# Patient Record
Sex: Male | Born: 1945 | Race: White | Hispanic: No | Marital: Married | Smoking: Former smoker
Health system: Southern US, Community
[De-identification: ages and names within clinical notes are randomized; demographics above are authoritative.]

## PROBLEM LIST (undated history)

## (undated) DIAGNOSIS — I639 Cerebral infarction, unspecified: Secondary | ICD-10-CM

## (undated) DIAGNOSIS — K859 Acute pancreatitis without necrosis or infection, unspecified: Secondary | ICD-10-CM

## (undated) DIAGNOSIS — C069 Malignant neoplasm of mouth, unspecified: Secondary | ICD-10-CM

## (undated) HISTORY — PX: REPLACEMENT TOTAL HIP W/  RESURFACING IMPLANTS: SUR1222

---

## 2007-12-15 ENCOUNTER — Encounter (HOSPITAL_BASED_OUTPATIENT_CLINIC_OR_DEPARTMENT_OTHER): Admission: RE | Admit: 2007-12-15 | Discharge: 2007-12-31 | Payer: Self-pay | Admitting: Surgery

## 2008-01-27 ENCOUNTER — Ambulatory Visit (HOSPITAL_COMMUNITY): Admission: RE | Admit: 2008-01-27 | Discharge: 2008-01-27 | Payer: Self-pay | Admitting: Surgery

## 2008-03-09 ENCOUNTER — Encounter (HOSPITAL_BASED_OUTPATIENT_CLINIC_OR_DEPARTMENT_OTHER): Admission: RE | Admit: 2008-03-09 | Discharge: 2008-04-08 | Payer: Self-pay | Admitting: Internal Medicine

## 2008-04-09 ENCOUNTER — Encounter (HOSPITAL_BASED_OUTPATIENT_CLINIC_OR_DEPARTMENT_OTHER): Admission: RE | Admit: 2008-04-09 | Discharge: 2008-05-04 | Payer: Self-pay | Admitting: Internal Medicine

## 2008-05-05 ENCOUNTER — Encounter (HOSPITAL_BASED_OUTPATIENT_CLINIC_OR_DEPARTMENT_OTHER): Admission: RE | Admit: 2008-05-05 | Discharge: 2008-05-20 | Payer: Self-pay | Admitting: Internal Medicine

## 2008-12-27 ENCOUNTER — Ambulatory Visit (HOSPITAL_COMMUNITY): Admission: RE | Admit: 2008-12-27 | Discharge: 2008-12-27 | Payer: Self-pay | Admitting: General Surgery

## 2008-12-27 ENCOUNTER — Encounter (HOSPITAL_BASED_OUTPATIENT_CLINIC_OR_DEPARTMENT_OTHER): Admission: RE | Admit: 2008-12-27 | Discharge: 2009-02-10 | Payer: Self-pay | Admitting: General Surgery

## 2009-01-26 ENCOUNTER — Ambulatory Visit: Payer: Self-pay | Admitting: Interventional Radiology

## 2009-01-26 ENCOUNTER — Emergency Department (HOSPITAL_BASED_OUTPATIENT_CLINIC_OR_DEPARTMENT_OTHER): Admission: EM | Admit: 2009-01-26 | Discharge: 2009-01-26 | Payer: Self-pay | Admitting: Emergency Medicine

## 2009-02-13 ENCOUNTER — Encounter (HOSPITAL_BASED_OUTPATIENT_CLINIC_OR_DEPARTMENT_OTHER): Admission: RE | Admit: 2009-02-13 | Discharge: 2009-03-07 | Payer: Self-pay | Admitting: Internal Medicine

## 2010-11-12 LAB — APTT: aPTT: 29 s (ref 24–37)

## 2010-11-12 LAB — COMPREHENSIVE METABOLIC PANEL
ALT: 15 U/L (ref 0–53)
AST: 25 U/L (ref 0–37)
Albumin: 3.9 g/dL (ref 3.5–5.2)
CO2: 27 mEq/L (ref 19–32)
Chloride: 102 mEq/L (ref 96–112)
GFR calc Af Amer: >60 mL/min (ref >60)
GFR calc non Af Amer: >60 mL/min (ref >60)
Potassium: 3.7 mEq/L (ref 3.5–5.1)
Sodium: 140 mEq/L (ref 135–145)
Total Bilirubin: 0.8 mg/dL (ref 0.3–1.2)

## 2010-11-12 LAB — CBC
HCT: 50.1 % (ref 39.0–52.0)
Hemoglobin: 16.8 g/dL (ref 13.0–17.0)
MCHC: 33.6 g/dL (ref 30.0–36.0)
MCV: 106.1 fL — ABNORMAL HIGH (ref 78.0–100.0)
Platelets: 175 K/uL (ref 150–400)
RBC: 4.72 MIL/uL (ref 4.22–5.81)
RDW: 12.1 % (ref 11.5–15.5)
WBC: 9 K/uL (ref 4.0–10.5)

## 2010-11-12 LAB — URINALYSIS, ROUTINE W REFLEX MICROSCOPIC
Glucose, UA: NEGATIVE mg/dL
Ketones, ur: NEGATIVE mg/dL
Leukocytes, UA: NEGATIVE
Protein, ur: NEGATIVE mg/dL
pH: 6 (ref 5.0–8.0)

## 2010-11-12 LAB — DIFFERENTIAL
Basophils Absolute: 0.1 K/uL (ref 0.0–0.1)
Basophils Relative: 1 % (ref 0–1)
Eosinophils Absolute: 0.1 K/uL (ref 0.0–0.7)
Eosinophils Relative: 1 % (ref 0–5)
Lymphocytes Relative: 20 % (ref 12–46)
Lymphs Abs: 1.8 K/uL (ref 0.7–4.0)
Monocytes Absolute: 0.6 K/uL (ref 0.1–1.0)
Monocytes Relative: 7 % (ref 3–12)
Neutro Abs: 6.4 K/uL (ref 1.7–7.7)
Neutrophils Relative %: 72 % (ref 43–77)

## 2010-11-12 LAB — POCT CARDIAC MARKERS: CKMB, poc: <1 ng/mL — ABNORMAL LOW (ref 1.0–8.0)

## 2010-11-12 LAB — PROTIME-INR
INR: 1 (ref 0.00–1.49)
Prothrombin Time: 13.4 s (ref 11.6–15.2)

## 2010-11-12 LAB — URINE MICROSCOPIC-ADD ON

## 2010-12-18 NOTE — Assessment & Plan Note (Signed)
[facility]   NAME:  GUNTHER, ZAWADZKI               ACCOUNT NO.:  [id number]   MEDICAL RECORD NO.:  [id number]      DATE OF BIRTH:  15-Apr-1946   PHYSICIAN:  Theresia Majors. Tanda Rockers, M.D.      VISIT DATE:                                   OFFICE VISIT   SUBJECTIVE:  Mr. Encina is a 65 year old man referred by Dr. Leafy Ro., DDS, for evaluation and treatment of osteoradionecrosis of  the left mandible.   IMPRESSION:  Stage I osteoradionecrosis, left mandible.   RECOMMENDATIONS:  Proceed with hyperbaric oxygen therapy to be  administered at 100% oxygen at 2.4 atm with two 5-minute air breaks.  The patient will require concurrent serial exams per Dr. Perlie Gold to  assess the response to therapy.  We are recommending an initial 30  treatments.   SUBJECTIVE:  Mr. 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  Over the last 2 years, he has had progressive  difficulty with the stability of his teeth.  He has been seen by Dr.  Perlie Gold on several occasions with respirations and has insidiously  developed and exposed ulcer on the inferior ramus of the left mandible.  He denies pain or drainage.   His past medical history is remarkable for significant ethanol  consumption.  He continues to smoke a pack of cigarettes a day.  He has  tried multiple modalities to discontinue smoking, but has failed.  He  has had a history of pancreatitis, benign prostatic hypertrophy, asthma,  osteoarthritis, and a history of jaundice.   He is allergic to PENICILLIN.   His current medications include multiple vitamins and Nexium.   His previous surgery has included a cholecystectomy, a right hip  replacement, tonsillectomy, and adenoidectomy.  He has also had the  aforementioned radiation therapy.   His family history is positive for cancer and vascular disease.   Socially, he  is married.  He has adult children.  He is an Psychologist, educational at  a moving company.   On review of systems, he denies a productive cough.  He denies transient  visual impairment of vision loss.  There has been no upper or lower  extremity paralysis, loss of vision, or lightheadedness.  He continues  to smoke and does not have a productive cough.  He denies hemoptysis.  He specifically denies chest pain.  His appetite is good.  His weight is  stable.  There are no bowel or bladder complaints.  There are no  arthralgias or myalgias.   On physical exam, he is alert and oriented in good contact with reality.  His blood pressure is 127/77, respirations 16, pulse rate 84, and  temperature is 98.2.  The HEENT exam is remarkable for normal-appearing  tympanic membranes.  The inferior turbinates are nonpathologic.  The  oral cavity exam shows multiple restorations with attention focused in  the inferior ramus of the left jaw.  There is a well-circumscribed area  of ulceration with visible ischemic-appearing bone.  The edges of the  gingiva are rolled down.  This area is somewhat tender to probing with a  Q-tip.  A photograph was taken of this area and entered into the  database (please refer to the image).  There is no cervical adenopathy.  The carotid upstroke is symmetrical.  Bruit is not appreciated.  The  trachea is midline.  Thyroid is nonpalpable.  Lungs are clear.  The  heart sounds are distant.  The abdomen is soft.  Extremity exam is  unremarkable.  The dorsalis pedis pulses are readily palpable.  The  patient moves all 4.   ASSESSMENT:  Stage I osteoradionecrosis.  We are recommending an initial  course of hyperbaric oxygen treatment as described above.  The patient  will require between 30 and 40 treatments initially with a follow up  exam per Dr. Perlie Gold.   We have explained the diagnosis, the indications for hyperbaric oxygen  therapy.  We have discussed the potential complications  specifically,  claustrophobia, barotrauma, and oxygen toxicity with the patient in  terms he seems to understand.  We have reviewed the restrictions of  material in the hyperbaric area as well as in the chamber.  We have  given the patient an opportunity to ask questions.  We provided him with  a copy of the patient information detailing the discussion.  He seems to  understand and expresses gratitude for having been seen in the clinic  and is anxious to proceed with hyperbarics.      Harold A. Tanda Rockers, M.D.  Electronically Signed     HAN/MEDQ  D:  12/18/2007  T:  12/19/2007  Job:  [id number]   cc:   Viviann Spare
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
## 2015-03-11 ENCOUNTER — Encounter (HOSPITAL_BASED_OUTPATIENT_CLINIC_OR_DEPARTMENT_OTHER): Payer: Self-pay | Admitting: Emergency Medicine

## 2015-03-11 ENCOUNTER — Emergency Department (HOSPITAL_BASED_OUTPATIENT_CLINIC_OR_DEPARTMENT_OTHER): Payer: Medicare HMO

## 2015-03-11 ENCOUNTER — Emergency Department (HOSPITAL_BASED_OUTPATIENT_CLINIC_OR_DEPARTMENT_OTHER)
Admission: EM | Admit: 2015-03-11 | Discharge: 2015-03-11 | Disposition: A | Discharge status: Home / Self Care | Payer: Medicare HMO | Attending: Emergency Medicine | Admitting: Emergency Medicine

## 2015-03-11 DIAGNOSIS — Z8719 Personal history of other diseases of the digestive system: Secondary | ICD-10-CM | POA: Insufficient documentation

## 2015-03-11 DIAGNOSIS — Z8673 Personal history of transient ischemic attack (TIA), and cerebral infarction without residual deficits: Secondary | ICD-10-CM | POA: Diagnosis not present

## 2015-03-11 DIAGNOSIS — Z85819 Personal history of malignant neoplasm of unspecified site of lip, oral cavity, and pharynx: Secondary | ICD-10-CM | POA: Insufficient documentation

## 2015-03-11 DIAGNOSIS — Z87891 Personal history of nicotine dependence: Secondary | ICD-10-CM | POA: Insufficient documentation

## 2015-03-11 DIAGNOSIS — M546 Pain in thoracic spine: Secondary | ICD-10-CM | POA: Diagnosis not present

## 2015-03-11 DIAGNOSIS — R109 Unspecified abdominal pain: Secondary | ICD-10-CM

## 2015-03-11 DIAGNOSIS — Z880 Allergy status to penicillin: Secondary | ICD-10-CM | POA: Diagnosis not present

## 2015-03-11 DIAGNOSIS — M545 Low back pain: Secondary | ICD-10-CM | POA: Diagnosis not present

## 2015-03-11 DIAGNOSIS — R079 Chest pain, unspecified: Secondary | ICD-10-CM | POA: Insufficient documentation

## 2015-03-11 DIAGNOSIS — R1012 Left upper quadrant pain: Secondary | ICD-10-CM | POA: Diagnosis not present

## 2015-03-11 HISTORY — DX: Malignant neoplasm of mouth, unspecified: C06.9

## 2015-03-11 HISTORY — DX: Acute pancreatitis without necrosis or infection, unspecified: K85.90

## 2015-03-11 HISTORY — DX: Cerebral infarction, unspecified: I63.9

## 2015-03-11 LAB — CBC WITH DIFFERENTIAL/PLATELET
BASOS PCT: 0 % (ref 0–1)
Basophils Absolute: 0 10*3/uL (ref 0.0–0.1)
Eosinophils Absolute: 0.2 10*3/uL (ref 0.0–0.7)
Eosinophils Relative: 1 % (ref 0–5)
HEMATOCRIT: 43 % (ref 39.0–52.0)
HEMOGLOBIN: 14.3 g/dL (ref 13.0–17.0)
LYMPHS ABS: 1.9 10*3/uL (ref 0.7–4.0)
LYMPHS PCT: 17 % (ref 12–46)
MCH: 34.4 pg — ABNORMAL HIGH (ref 26.0–34.0)
MCHC: 33.3 g/dL (ref 30.0–36.0)
MCV: 103.4 fL — ABNORMAL HIGH (ref 78.0–100.0)
Monocytes Absolute: 0.9 10*3/uL (ref 0.1–1.0)
Monocytes Relative: 8 % (ref 3–12)
NEUTROS ABS: 8.5 10*3/uL — AB (ref 1.7–7.7)
Neutrophils Relative %: 74 % (ref 43–77)
Platelets: 169 10*3/uL (ref 150–400)
RBC: 4.16 MIL/uL — ABNORMAL LOW (ref 4.22–5.81)
RDW: 12.1 % (ref 11.5–15.5)
WBC: 11.5 10*3/uL — ABNORMAL HIGH (ref 4.0–10.5)

## 2015-03-11 LAB — URINALYSIS, ROUTINE W REFLEX MICROSCOPIC
BILIRUBIN URINE: NEGATIVE
Glucose, UA: NEGATIVE mg/dL
Hgb urine dipstick: NEGATIVE
Ketones, ur: 15 mg/dL — AB
Leukocytes, UA: NEGATIVE
Nitrite: NEGATIVE
PH: 6 (ref 5.0–8.0)
PROTEIN: NEGATIVE mg/dL
Specific Gravity, Urine: 1.019 (ref 1.005–1.030)
Urobilinogen, UA: 0.2 mg/dL (ref 0.0–1.0)

## 2015-03-11 LAB — COMPREHENSIVE METABOLIC PANEL
ALBUMIN: 3.7 g/dL (ref 3.5–5.0)
ALT: 19 U/L (ref 17–63)
AST: 19 U/L (ref 15–41)
Alkaline Phosphatase: 68 U/L (ref 38–126)
Anion gap: 10 (ref 5–15)
BILIRUBIN TOTAL: 0.7 mg/dL (ref 0.3–1.2)
BUN: 13 mg/dL (ref 6–20)
CALCIUM: 8.6 mg/dL — AB (ref 8.9–10.3)
CO2: 26 mmol/L (ref 22–32)
CREATININE: 1.02 mg/dL (ref 0.61–1.24)
Chloride: 103 mmol/L (ref 101–111)
GLUCOSE: 108 mg/dL — AB (ref 65–99)
Potassium: 4.3 mmol/L (ref 3.5–5.1)
Sodium: 139 mmol/L (ref 135–145)
Total Protein: 6.6 g/dL (ref 6.5–8.1)

## 2015-03-11 LAB — TROPONIN I

## 2015-03-11 LAB — LIPASE, BLOOD: Lipase: 38 U/L (ref 22–51)

## 2015-03-11 MED ORDER — IOHEXOL 300 MG/ML  SOLN
100.0000 mL | Freq: Once | INTRAMUSCULAR | Status: AC | PRN
  Administered 2015-03-11: 100 mL via INTRAVENOUS

## 2015-03-11 MED ORDER — SODIUM CHLORIDE 0.9 % IV SOLN
Freq: Once | INTRAVENOUS | Status: AC
  Administered 2015-03-11: 17:00:00 via INTRAVENOUS

## 2015-03-11 MED ORDER — IOHEXOL 300 MG/ML  SOLN
25.0000 mL | Freq: Once | INTRAMUSCULAR | Status: AC | PRN
  Administered 2015-03-11: 25 mL via ORAL

## 2015-03-11 MED ORDER — ONDANSETRON 4 MG PO TBDP: 4.0000 mg | ORAL_TABLET | Freq: Three times a day (TID) | ORAL | Status: DC | PRN

## 2015-03-11 MED ORDER — MORPHINE SULFATE 4 MG/ML IJ SOLN
4.0000 mg | INTRAMUSCULAR | Status: DC | PRN
  Administered 2015-03-11: 4 mg via INTRAVENOUS
  Filled 2015-03-11: qty 1

## 2015-03-11 MED ORDER — SUCRALFATE 1 G PO TABS: 1.0000 g | ORAL_TABLET | Freq: Four times a day (QID) | ORAL | Status: AC

## 2015-03-11 MED ORDER — ONDANSETRON HCL 4 MG/2ML IJ SOLN
4.0000 mg | Freq: Once | INTRAMUSCULAR | Status: AC
  Administered 2015-03-11: 4 mg via INTRAVENOUS
  Filled 2015-03-11: qty 2

## 2015-03-11 NOTE — Discharge Instructions (Signed)
Small meals. Liquids only today. Avoid alcohol, tobacco, caffeine, and anti-inflammatory medicines like aspirin Motrin and naproxen. Continue your Protonix. Return here with any worsening symptoms including chest pain shortness of breath fever   Abdominal Pain Many things can cause abdominal pain. Usually, abdominal pain is not caused by a disease and will improve without treatment. It can often be observed and treated at home. Your health care provider will do a physical exam and possibly order blood tests and X-rays to help determine the seriousness of your pain. However, in many cases, more time must pass before a clear cause of the pain can be found. Before that point, your health care provider may not know if you need more testing or further treatment. HOME CARE INSTRUCTIONS  Monitor your abdominal pain for any changes. The following actions may help to alleviate any discomfort you are experiencing:  Only take over-the-counter or prescription medicines as directed by your health care provider.  Do not take laxatives unless directed to do so by your health care provider.  Try a clear liquid diet (broth, tea, or water) as directed by your health care provider. Slowly move to a bland diet as tolerated. SEEK MEDICAL CARE IF:  You have unexplained abdominal pain.  You have abdominal pain associated with nausea or diarrhea.  You have pain when you urinate or have a bowel movement.  You experience abdominal pain that wakes you in the night.  You have abdominal pain that is worsened or improved by eating food.  You have abdominal pain that is worsened with eating fatty foods.  You have a fever. SEEK IMMEDIATE MEDICAL CARE IF:   Your pain does not go away within 2 hours.  You keep throwing up (vomiting).  Your pain is felt only in portions of the abdomen, such as the right side or the left lower portion of the abdomen.  You pass bloody or black tarry stools. MAKE SURE  YOU:  Understand these instructions.   Will watch your condition.   Will get help right away if you are not doing well or get worse.  Document Released: 05/01/2005 Document Revised: 07/27/2013 Document Reviewed: 03/31/2013 [facility] Patient Information 2015 [place], [place]. This information is not intended to replace advice given to you by your health care provider. Make sure you discuss any questions you have with your health care provider.  Chest Pain (Nonspecific) It is often hard to give a specific diagnosis for the cause of chest pain. There is always a chance that your pain could be related to something serious, such as a heart attack or a blood clot in the lungs. You need to follow up with your health care provider for further evaluation. CAUSES   Heartburn.  Pneumonia or bronchitis.  Anxiety or stress.  Inflammation around your heart (pericarditis) or lung (pleuritis or pleurisy).  A blood clot in the lung.  A collapsed lung (pneumothorax). It can develop suddenly on its own (spontaneous pneumothorax) or from trauma to the chest.  Shingles infection (herpes zoster virus). The chest wall is composed of bones, muscles, and cartilage. Any of these can be the source of the pain.  The bones can be bruised by injury.  The muscles or cartilage can be strained by coughing or overwork.  The cartilage can be affected by inflammation and become sore (costochondritis). DIAGNOSIS  Lab tests or other studies may be needed to find the cause of your pain. Your health care provider may have you take a test called an  ambulatory electrocardiogram (ECG). An ECG records your heartbeat patterns over a 24-hour period. You may also have other tests, such as:  Transthoracic echocardiogram (TTE). During echocardiography, sound waves are used to evaluate how blood flows through your heart.  Transesophageal echocardiogram (TEE).  Cardiac monitoring. This allows your health care provider to  monitor your heart rate and rhythm in real time.  Holter monitor. This is a portable device that records your heartbeat and can help diagnose heart arrhythmias. It allows your health care provider to track your heart activity for several days, if needed.  Stress tests by exercise or by giving medicine that makes the heart beat faster. TREATMENT   Treatment depends on what may be causing your chest pain. Treatment may include:  Acid blockers for heartburn.  Anti-inflammatory medicine.  Pain medicine for inflammatory conditions.  Antibiotics if an infection is present.  You may be advised to change lifestyle habits. This includes stopping smoking and avoiding alcohol, caffeine, and chocolate.  You may be advised to keep your head raised (elevated) when sleeping. This reduces the chance of acid going backward from your stomach into your esophagus. Most of the time, nonspecific chest pain will improve within 2-3 days with rest and mild pain medicine.  HOME CARE INSTRUCTIONS   If antibiotics were prescribed, take them as directed. Finish them even if you start to feel better.  For the next few days, avoid physical activities that bring on chest pain. Continue physical activities as directed.  Do not use any tobacco products, including cigarettes, chewing tobacco, or electronic cigarettes.  Avoid drinking alcohol.  Only take medicine as directed by your health care provider.  Follow your health care provider's suggestions for further testing if your chest pain does not go away.  Keep any follow-up appointments you made. If you do not go to an appointment, you could develop lasting (chronic) problems with pain. If there is any problem keeping an appointment, call to reschedule. SEEK MEDICAL CARE IF:   Your chest pain does not go away, even after treatment.  You have a rash with blisters on your chest.  You have a fever. SEEK IMMEDIATE MEDICAL CARE IF:   You have increased chest  pain or pain that spreads to your arm, neck, jaw, back, or abdomen.  You have shortness of breath.  You have an increasing cough, or you cough up blood.  You have severe back or abdominal pain.  You feel nauseous or vomit.  You have severe weakness.  You faint.  You have chills. This is an emergency. Do not wait to see if the pain will go away. Get medical help at once. Call your local emergency services (911 in [place] Do not drive yourself to the hospital. MAKE SURE YOU:   Understand these instructions.  Will watch your condition.  Will get help right away if you are not doing well or get worse. Document Released: 05/01/2005 Document Revised: 07/27/2013 Document Reviewed: 02/25/2008 [facility] Patient Information 2015 [place], [place]. This information is not intended to replace advice given to you by your health care provider. Make sure you discuss any questions you have with your health care provider.

## 2015-03-11 NOTE — ED Notes (Signed)
Patient transported to CT 

## 2015-03-11 NOTE — ED Notes (Signed)
Patient transported to X-ray 

## 2015-03-11 NOTE — ED Notes (Signed)
Pt in c/o back pain, L lateral rib cage pain onset after working in the garden all day. Also states abdominal pain onset last night that lasted all night and into the day. Pt is in NAD in triage.

## 2015-03-11 NOTE — ED Provider Notes (Signed)
CSN: [id number]   Arrival date & time 03/11/15 1525  History  This chart was scribed for  Samuel Furry, MD by Altamease Oiler, ED Scribe. This patient was seen in room MH12/MH12 and the patient's care was started at 3:55 PM.  Chief Complaint  Patient presents with  . Chest Pain  . Abdominal Pain    HPI The history is provided by the patient. No language interpreter was used.   Samuel Mccarthy is a 69 y.o. male with PMHx of pancreatitis who presents to the Emergency Department complaining of constant mid abdominal pain with sudden onset around 3 AM this morning. The pain is described as pressure and less severe than previous pain with pancreatitis. His last episode of pancreatitis was more than 5 years ago.  Associated symptoms include left-sided chest pain with onset before 11:30 AM (sharp and shooting)and new dull lower thoracic and upper lumbar pain. He had a normal bowel movement today without blood or mucous. Last week he had 4 days of nausea and vomiting.  No increased abdominal bloating, current nausea or vomiting, diarrhea, hematochezia, dysuria, and difficulty urinating. Today the pt did yard and garden work. He ate an egg and a spoon of peanut butter. Past surgical history is significant for appendectomy and cholecystectomy. No history of CAD or kidney stones. Last used tobacco 6 years ago. Daily drinker (3-4 beers).   Past Medical History  Diagnosis Date  . Pancreatitis   . Oral cancer   . Stroke     Past Surgical History  Procedure Laterality Date  . Replacement total hip w/  resurfacing implants      History reviewed. No pertinent family history.  History  Substance Use Topics  . 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 Smokeless tobacco: Never Used  . Alcohol Use: 2.4 oz/week    4 Cans of beer per week     Comment: every day     Review of Systems  Constitutional: Negative for diaphoresis and appetite change.  HENT: Negative for mouth sores and trouble swallowing.   Eyes:  Negative for visual disturbance.  Respiratory: Negative for chest tightness and wheezing.   Cardiovascular: Positive for chest pain.  Gastrointestinal: Positive for abdominal pain. Negative for abdominal distention.  Endocrine: Negative for polydipsia, polyphagia and polyuria.  Genitourinary: Negative for frequency.  Musculoskeletal: Positive for back pain. Negative for gait problem.  Skin: Negative for color change, pallor and rash.  Neurological: Negative for dizziness, syncope, light-headedness and headaches.  Hematological: Does not bruise/bleed easily.  Psychiatric/Behavioral: Negative for behavioral problems and confusion.     Home Medications   Prior to Admission medications   Not on File    Allergies  Penicillins and Percocet  Triage Vitals: BP 154/79 mmHg  Pulse 100  Temp(Src) 98.1 F (36.7 C) (Oral)  Resp 20  Ht 5\' 5"  (1.651 m)  Wt 184 lb (83.462 kg)  BMI 30.62 kg/m2  SpO2 95%  Physical Exam  Constitutional: He is oriented to person, place, and time. He appears well-developed and well-nourished. No distress.  HENT:  Head: Normocephalic.  Mouth/Throat: Mucous membranes are normal.  Eyes: Conjunctivae are normal. Pupils are equal, round, and reactive to light. No scleral icterus.  Neck: Normal range of motion. Neck supple. No thyromegaly present.  Cardiovascular: Normal rate and regular rhythm.  Exam reveals no gallop and no friction rub.   No murmur heard. Pulmonary/Chest: Effort normal and breath sounds normal. No respiratory distress. He has no wheezes. He has no rales. He  exhibits tenderness.  Left upper lateral chest tenderness  Abdominal: Soft. Bowel sounds are normal. He exhibits no distension. There is tenderness in the left upper quadrant. There is no rebound.  Musculoskeletal: Normal range of motion.  Neurological: He is alert and oriented to person, place, and time.  Skin: Skin is warm and dry. No rash noted.  Psychiatric: He has a normal mood and  affect. His behavior is normal.    ED Course  Procedures   DIAGNOSTIC STUDIES: Oxygen Saturation is 95% on RA, normal by my interpretation.    COORDINATION OF CARE: 4:07 PM Discussed treatment plan which includes CT A/P with contrast, CXR, lab work, EKG, morphine, Zofran, and IVF  with pt at bedside and pt agreed to plan.  Labs Review-  Labs Reviewed  CBC WITH DIFFERENTIAL/PLATELET - Abnormal; Notable for the following:    WBC 11.5 (*)    RBC 4.16 (*)    MCV 103.4 (*)    MCH 34.4 (*)    Neutro Abs 8.5 (*)    All other components within normal limits  COMPREHENSIVE METABOLIC PANEL  LIPASE, BLOOD  TROPONIN I  URINALYSIS, ROUTINE W REFLEX MICROSCOPIC (NOT AT [facility])    Imaging Review No results found.  EKG Interpretation  Date/Time:  Saturday March 11 2015 15:33:25 EDT Ventricular Rate:  102 PR Interval:  168 QRS Duration: 82 QT Interval:  328 QTC Calculation: 427 R Axis:   8 Text Interpretation:  Sinus tachycardia Minimal voltage criteria for LVH, may be normal variant Borderline ECG Confirmed by Jeneen Rinks  MD, [place] ([id number]) on 03/11/2015 4:07:42 PM       MDM   Final diagnoses:  Abdominal pain  Chest pain  Abdominal pain  Chest pain    Reassuring studies. No aneurysm. No inflammation of the appendix. No elevation of hepatobiliary pain chronic enzymes. Normal troponin after more than 10 hours of symptoms. Normal x-ray. Unchanged EKG. Symptoms sound more GI. His been eating a lot of fried zuccini  and tomatoes out of his garden. His wife states he drinks coffee all day long. He has 3-5 beers per day. Plan will be treatment with Carafate in addition to his Protonix. Reflux and peptic ulcer diet and precautions. ER with any changes.   .I personally performed the services described in this documentation, which was scribed in my presence. The recorded information has been reviewed and is accurate.     Samuel Furry, MD 03/11/15 [phone]

## 2018-05-02 ENCOUNTER — Other Ambulatory Visit: Payer: Self-pay

## 2018-05-02 ENCOUNTER — Emergency Department (HOSPITAL_BASED_OUTPATIENT_CLINIC_OR_DEPARTMENT_OTHER)
Admission: EM | Admit: 2018-05-02 | Discharge: 2018-05-02 | Disposition: A | Discharge status: Home / Self Care | Payer: Medicare HMO | Attending: Emergency Medicine | Admitting: Emergency Medicine

## 2018-05-02 ENCOUNTER — Emergency Department (HOSPITAL_BASED_OUTPATIENT_CLINIC_OR_DEPARTMENT_OTHER): Payer: Medicare HMO

## 2018-05-02 ENCOUNTER — Encounter (HOSPITAL_BASED_OUTPATIENT_CLINIC_OR_DEPARTMENT_OTHER): Payer: Self-pay | Admitting: Emergency Medicine

## 2018-05-02 DIAGNOSIS — Z8673 Personal history of transient ischemic attack (TIA), and cerebral infarction without residual deficits: Secondary | ICD-10-CM | POA: Insufficient documentation

## 2018-05-02 DIAGNOSIS — Z7982 Long term (current) use of aspirin: Secondary | ICD-10-CM | POA: Diagnosis not present

## 2018-05-02 DIAGNOSIS — Z7901 Long term (current) use of anticoagulants: Secondary | ICD-10-CM | POA: Insufficient documentation

## 2018-05-02 DIAGNOSIS — Z79899 Other long term (current) drug therapy: Secondary | ICD-10-CM | POA: Diagnosis not present

## 2018-05-02 DIAGNOSIS — R109 Unspecified abdominal pain: Secondary | ICD-10-CM | POA: Diagnosis present

## 2018-05-02 DIAGNOSIS — K859 Acute pancreatitis without necrosis or infection, unspecified: Secondary | ICD-10-CM | POA: Diagnosis not present

## 2018-05-02 LAB — URINALYSIS, ROUTINE W REFLEX MICROSCOPIC
Bilirubin Urine: NEGATIVE
GLUCOSE, UA: NEGATIVE mg/dL
KETONES UR: NEGATIVE mg/dL
Leukocytes, UA: NEGATIVE
NITRITE: NEGATIVE
PH: 5.5 (ref 5.0–8.0)
Protein, ur: 30 mg/dL — AB
Specific Gravity, Urine: 1.025 (ref 1.005–1.030)

## 2018-05-02 LAB — COMPREHENSIVE METABOLIC PANEL
ALT: 17 U/L (ref 0–44)
AST: 18 U/L (ref 15–41)
Albumin: 3.9 g/dL (ref 3.5–5.0)
Alkaline Phosphatase: 70 U/L (ref 38–126)
Anion gap: 10 (ref 5–15)
BILIRUBIN TOTAL: 1.1 mg/dL (ref 0.3–1.2)
BUN: 13 mg/dL (ref 8–23)
CHLORIDE: 102 mmol/L (ref 98–111)
CO2: 24 mmol/L (ref 22–32)
Calcium: 8.8 mg/dL — ABNORMAL LOW (ref 8.9–10.3)
Creatinine, Ser: 0.91 mg/dL (ref 0.61–1.24)
GFR calc Af Amer: >60 mL/min (ref >60)
GFR calc non Af Amer: >60 mL/min (ref >60)
GLUCOSE: 140 mg/dL — AB (ref 70–99)
POTASSIUM: 4.2 mmol/L (ref 3.5–5.1)
SODIUM: 136 mmol/L (ref 135–145)
TOTAL PROTEIN: 7.2 g/dL (ref 6.5–8.1)

## 2018-05-02 LAB — CBC
HCT: 45.9 % (ref 39.0–52.0)
Hemoglobin: 15.6 g/dL (ref 13.0–17.0)
MCH: 34.3 pg — ABNORMAL HIGH (ref 26.0–34.0)
MCHC: 34 g/dL (ref 30.0–36.0)
MCV: 100.9 fL — AB (ref 78.0–100.0)
PLATELETS: 177 10*3/uL (ref 150–400)
RBC: 4.55 MIL/uL (ref 4.22–5.81)
RDW: 12.4 % (ref 11.5–15.5)
WBC: 14.3 10*3/uL — ABNORMAL HIGH (ref 4.0–10.5)

## 2018-05-02 LAB — URINALYSIS, MICROSCOPIC (REFLEX)

## 2018-05-02 LAB — LIPASE, BLOOD: Lipase: 50 U/L (ref 11–51)

## 2018-05-02 MED ORDER — ONDANSETRON HCL 4 MG/2ML IJ SOLN
4.0000 mg | Freq: Once | INTRAMUSCULAR | Status: AC
  Administered 2018-05-02: 4 mg via INTRAVENOUS
  Filled 2018-05-02: qty 2

## 2018-05-02 MED ORDER — MORPHINE SULFATE 15 MG PO TABS: 7.5000 mg | ORAL_TABLET | Freq: Four times a day (QID) | ORAL | 0 refills | Status: AC | PRN

## 2018-05-02 MED ORDER — MORPHINE SULFATE (PF) 4 MG/ML IV SOLN
4.0000 mg | Freq: Once | INTRAVENOUS | Status: AC
  Administered 2018-05-02: 4 mg via INTRAVENOUS
  Filled 2018-05-02: qty 1

## 2018-05-02 MED ORDER — IOPAMIDOL (ISOVUE-300) INJECTION 61%
100.0000 mL | Freq: Once | INTRAVENOUS | Status: AC | PRN
  Administered 2018-05-02: 100 mL via INTRAVENOUS

## 2018-05-02 MED ORDER — SODIUM CHLORIDE 0.9 % IV BOLUS
500.0000 mL | Freq: Once | INTRAVENOUS | Status: AC
  Administered 2018-05-02: 500 mL via INTRAVENOUS

## 2018-05-02 NOTE — ED Triage Notes (Addendum)
Generalized sharp abd pain since 11 last night. Denies N/V/D. Pt also has been on clindamycin since June for oral infection. His wife states he gets these infections frequently following radiation for oral cancer. He is also concerned about tail bone and lower back pain.

## 2018-05-02 NOTE — ED Notes (Signed)
Patient ambulated on r/a, steady gait, HR 100-110, denies DOE, SPO2 91-94%.

## 2018-05-02 NOTE — ED Notes (Signed)
Patient transported to CT 

## 2018-05-02 NOTE — ED Notes (Signed)
ED Provider at bedside. 

## 2018-05-02 NOTE — ED Provider Notes (Signed)
[facility] EMERGENCY DEPARTMENT Provider Note   CSN: [id number] Arrival date & time: 05/02/18  0859     History   Chief Complaint Chief Complaint  Patient presents with  . Abdominal Pain  . Back Pain    HPI Samuel Mccarthy is a 72 y.o. male.  The history is provided by the patient and the spouse. No language interpreter was used.  Abdominal Pain    Back Pain   Associated symptoms include abdominal pain.   Samuel Mccarthy is a 71 y.o. male who presents to the Emergency Department complaining of pain and back pain. He presents for evaluation of severe generalized abdominal pain that began at 10 PM last night. Pain migrates throughout the abdomen but he describes it as general in nature and radiates to the back. He reports constipation starting yesterday and straining to have a bowel movement about 4 PM. He had no pain at that time. He denies any fevers. He does have some nausea. No vomiting, chest pain, shortness of breath, leg swelling or pain. No prior similar symptoms. 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 He is on daily clindamycin since June for jaw infection. He has a history of prior cholecystectomy and appendectomy. Past Medical History:  Diagnosis Date  . Oral cancer ([place])   . Pancreatitis   . Stroke [facility])     There are no active problems to display for this patient.   Past Surgical History:  Procedure Laterality Date  . REPLACEMENT TOTAL HIP W/  RESURFACING IMPLANTS          Home Medications    Prior to Admission medications   Medication Sig Start Date End Date Taking? Authorizing Provider  aspirin 325 MG EC tablet Take 325 mg by mouth daily.   Yes [provider]  atorvastatin (LIPITOR) 20 MG tablet Take 20 mg by mouth daily.   Yes [provider]  clindamycin (CLEOCIN) 300 MG capsule Take 300 mg by mouth 3 (three) times daily.   Yes [provider]    clopidogrel (PLAVIX) 75 MG tablet Take 75 mg by mouth daily.   Yes [provider]  Pantoprazole Sodium (PROTONIX PO) Take by mouth.   Yes [provider]  Loratadine (CLARITIN PO) Take by mouth.    [provider]  morphine (MSIR) 15 MG tablet Take 0.5-1 tablets (7.5-15 mg total) by mouth every 6 (six) hours as needed for severe pain. 05/02/18   Quintella Reichert, MD  multivitamin/minerals (GOLDEN AGE) LIQD Take by mouth daily.    [provider]  ondansetron (ZOFRAN ODT) 4 MG disintegrating tablet Take 1 tablet (4 mg total) by mouth every 8 (eight) hours as needed for nausea. 03/11/15   [name], MD  sucralfate (CARAFATE) 1 G tablet Take 1 tablet (1 g total) by mouth 4 (four) times daily. 03/11/15   [name], MD    Family History No family history on file.  Social History Social History   Tobacco Use  . 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 Smokeless tobacco: Never Used  Substance Use Topics  . Alcohol use: Yes    Alcohol/week: 4.0 standard drinks    Types: 4 Cans of beer per week    Comment: every day  . Drug use: Not on file     Allergies   Penicillins and Percocet [oxycodone-acetaminophen]   Review of Systems Review of Systems  Gastrointestinal: Positive for abdominal  pain.  Musculoskeletal: Positive for back pain.  All other systems reviewed and are negative.    Physical Exam Updated Vital Signs BP (!) 153/79   Pulse 93   Temp 98.2 F (36.8 C) (Oral)   Resp 18   Ht 5\' 5"  (1.651 m)   Wt 87.5 kg   SpO2 97%   BMI 32.12 kg/m   Physical Exam  Constitutional: He is oriented to person, place, and time. He appears well-developed and well-nourished.  HENT:  Head: Normocephalic and atraumatic.  Cardiovascular: Regular rhythm.  No murmur heard. Tachycardic  Pulmonary/Chest: Effort normal and breath sounds normal. No respiratory distress.  Abdominal: Soft. There is no rebound and no guarding.  Well-healed scar in the right upper  quadrant. Normoactive bowel sounds. Mild generalized abdominal tenderness with moderate tenderness in the right hemi abdomen.  Musculoskeletal: He exhibits no edema or tenderness.  Neurological: He is alert and oriented to person, place, and time.  Skin: Skin is warm and dry.  Psychiatric: He has a normal mood and affect. His behavior is normal.  Nursing note and vitals reviewed.    ED Treatments / Results  Labs (all labs ordered are listed, but only abnormal results are displayed) Labs Reviewed  COMPREHENSIVE METABOLIC PANEL - Abnormal; Notable for the following components:      Result Value   Glucose, Bld 140 (*)    Calcium 8.8 (*)    All other components within normal limits  CBC - Abnormal; Notable for the following components:   WBC 14.3 (*)    MCV 100.9 (*)    MCH 34.3 (*)    All other components within normal limits  URINALYSIS, ROUTINE W REFLEX MICROSCOPIC - Abnormal; Notable for the following components:   Hgb urine dipstick MODERATE (*)    Protein, ur 30 (*)    All other components within normal limits  URINALYSIS, MICROSCOPIC (REFLEX) - Abnormal; Notable for the following components:   Bacteria, UA MANY (*)    All other components within normal limits  URINE CULTURE  LIPASE, BLOOD    EKG None  Radiology Ct Abdomen Pelvis W Contrast  Result Date: 05/02/2018 CLINICAL DATA:  Acute abdominal pain.  Elevated WBC. EXAM: CT ABDOMEN AND PELVIS WITH CONTRAST TECHNIQUE: Multidetector CT imaging of the abdomen and pelvis was performed using the standard protocol following bolus administration of intravenous contrast. CONTRAST:  166mL ISOVUE-300 IOPAMIDOL (ISOVUE-300) INJECTION 61% COMPARISON:  03/11/2015 FINDINGS: Lower chest: No acute abnormality.  Coronary artery atherosclerosis. Hepatobiliary: Diffuse low attenuation of the liver as can be seen with hepatic steatosis. No focal hepatic mass. Prior cholecystectomy. Pancreas: Peripancreatic inflammatory changes around the  distal pancreatic body and tail. No peripancreatic fluid collection. Pancreas enhances normally and homogeneously without areas of necrosis. Spleen: Normal in size without focal abnormality. Adrenals/Urinary Tract: Adrenal glands are unremarkable. Kidneys are normal, without renal calculi, focal lesion, or hydronephrosis. Bladder is unremarkable. Stomach/Bowel: Stomach is within normal limits. Appendix appears normal. No evidence of bowel wall thickening, distention, or inflammatory changes. Diverticulosis without evidence of diverticulitis. Vascular/Lymphatic: Abdominal aortic atherosclerosis. No lymphadenopathy. Normal caliber abdominal aorta. Reproductive: Prostate is unremarkable. Other: No abdominal wall hernia or abnormality. No abdominopelvic ascites. Musculoskeletal: No acute osseous abnormality. Prior right hip arthroplasty without failure or complication. Beam hardening artifact resulting from the arthroplasty partially obscures adjacent soft tissue and osseous structures. Sclerotic bone lesion in the L4 vertebral body most consistent with a small bone island. IMPRESSION: 1. Acute pancreatitis with inflammatory changes around the distal pancreatic  body and pancreatic tail. No focal fluid collection to suggest an abscess or pseudocyst. No areas of pancreatic necrosis. 2.  Aortic Atherosclerosis (ICD10-I70.0). 3. Diverticulosis without evidence of diverticulitis. Electronically Signed   By: Kathreen Devoid   On: 05/02/2018 10:57    Procedures Procedures (including critical care time)  Medications Ordered in ED Medications  ondansetron (ZOFRAN) injection 4 mg (4 mg Intravenous Given 05/02/18 1000)  morphine 4 MG/ML injection 4 mg (4 mg Intravenous Given 05/02/18 1000)  sodium chloride 0.9 % bolus 500 mL (0 mLs Intravenous Stopped 05/02/18 1114)  iopamidol (ISOVUE-300) 61 % injection 100 mL (100 mLs Intravenous Contrast Given 05/02/18 1006)     Initial Impression / Assessment and Plan / ED Course  I  have reviewed the triage vital signs and the nursing notes.  Pertinent labs & imaging results that were available during my care of the patient were reviewed by me and considered in my medical decision making (see chart for details).     Patient here for evaluation of abdominal pain. CT scan is consistent with acute pancreatitis despite normal lipase. UA not consistent with UTI, patient without dysuria. CBC with mild leukocytosis. Discussed with patient findings of cholecystitis. He is status post cholecystectomy. He is a moderate drinker and has 2 to 4 beers daily. Discussed concern that alcohol use may be contributing to his pancreatitis and recommend decreasing and possibly discontinuing alcohol. Patient does have ongoing pain in the emergency department and recommend admission with observation and IV fluids and patient declines. He prefers discharge home. He is able to tolerate oral fluids in the department without difficulty. Plan to discharge home with close PCP follow-up as well as return precautions.  Final Clinical Impressions(s) / ED Diagnoses   Final diagnoses:  Acute pancreatitis without infection or necrosis, unspecified pancreatitis type    ED Discharge Orders         Ordered    morphine (MSIR) 15 MG tablet  Every 6 hours PRN     05/02/18 1244           Quintella Reichert, MD 05/02/18 1507

## 2018-05-03 LAB — URINE CULTURE

## 2020-03-07 ENCOUNTER — Other Ambulatory Visit: Payer: Self-pay

## 2020-03-07 ENCOUNTER — Emergency Department (HOSPITAL_BASED_OUTPATIENT_CLINIC_OR_DEPARTMENT_OTHER)
Admission: EM | Admit: 2020-03-07 | Discharge: 2020-03-07 | Disposition: A | Discharge status: Home / Self Care | Payer: Medicare HMO | Attending: Emergency Medicine | Admitting: Emergency Medicine

## 2020-03-07 ENCOUNTER — Emergency Department (HOSPITAL_BASED_OUTPATIENT_CLINIC_OR_DEPARTMENT_OTHER): Payer: Medicare HMO

## 2020-03-07 ENCOUNTER — Encounter (HOSPITAL_BASED_OUTPATIENT_CLINIC_OR_DEPARTMENT_OTHER): Payer: Self-pay | Admitting: Emergency Medicine

## 2020-03-07 DIAGNOSIS — K5792 Diverticulitis of intestine, part unspecified, without perforation or abscess without bleeding: Secondary | ICD-10-CM | POA: Diagnosis not present

## 2020-03-07 DIAGNOSIS — R1032 Left lower quadrant pain: Secondary | ICD-10-CM | POA: Diagnosis present

## 2020-03-07 DIAGNOSIS — R197 Diarrhea, unspecified: Secondary | ICD-10-CM | POA: Diagnosis not present

## 2020-03-07 DIAGNOSIS — Z87891 Personal history of nicotine dependence: Secondary | ICD-10-CM | POA: Diagnosis not present

## 2020-03-07 DIAGNOSIS — Z7982 Long term (current) use of aspirin: Secondary | ICD-10-CM | POA: Insufficient documentation

## 2020-03-07 DIAGNOSIS — Z880 Allergy status to penicillin: Secondary | ICD-10-CM | POA: Insufficient documentation

## 2020-03-07 LAB — COMPREHENSIVE METABOLIC PANEL
ALT: 19 U/L (ref 0–44)
AST: 21 U/L (ref 15–41)
Albumin: 3.9 g/dL (ref 3.5–5.0)
Alkaline Phosphatase: 66 U/L (ref 38–126)
Anion gap: 9 (ref 5–15)
BUN: 9 mg/dL (ref 8–23)
CO2: 27 mmol/L (ref 22–32)
Calcium: 8.8 mg/dL — ABNORMAL LOW (ref 8.9–10.3)
Chloride: 103 mmol/L (ref 98–111)
Creatinine, Ser: 0.77 mg/dL (ref 0.61–1.24)
GFR calc Af Amer: >60 mL/min (ref >60)
GFR calc non Af Amer: >60 mL/min (ref >60)
Glucose, Bld: 98 mg/dL (ref 70–99)
Potassium: 4.1 mmol/L (ref 3.5–5.1)
Sodium: 139 mmol/L (ref 135–145)
Total Bilirubin: 0.6 mg/dL (ref 0.3–1.2)
Total Protein: 7.1 g/dL (ref 6.5–8.1)

## 2020-03-07 LAB — CBC
HCT: 45.1 % (ref 39.0–52.0)
Hemoglobin: 15.3 g/dL (ref 13.0–17.0)
MCH: 34.9 pg — ABNORMAL HIGH (ref 26.0–34.0)
MCHC: 33.9 g/dL (ref 30.0–36.0)
MCV: 102.7 fL — ABNORMAL HIGH (ref 80.0–100.0)
Platelets: 164 10*3/uL (ref 150–400)
RBC: 4.39 MIL/uL (ref 4.22–5.81)
RDW: 12.2 % (ref 11.5–15.5)
WBC: 8.3 10*3/uL (ref 4.0–10.5)
nRBC: 0 % (ref 0.0–0.2)

## 2020-03-07 LAB — URINALYSIS, ROUTINE W REFLEX MICROSCOPIC
Bilirubin Urine: NEGATIVE
Glucose, UA: NEGATIVE mg/dL
Ketones, ur: NEGATIVE mg/dL
Leukocytes,Ua: NEGATIVE
Nitrite: NEGATIVE
Protein, ur: NEGATIVE mg/dL
Specific Gravity, Urine: 1.01 (ref 1.005–1.030)
pH: 5.5 (ref 5.0–8.0)

## 2020-03-07 LAB — LIPASE, BLOOD: Lipase: 35 U/L (ref 11–51)

## 2020-03-07 LAB — URINALYSIS, MICROSCOPIC (REFLEX): WBC, UA: NONE SEEN WBC/hpf (ref 0–5)

## 2020-03-07 MED ORDER — IOHEXOL 300 MG/ML  SOLN
100.0000 mL | Freq: Once | INTRAMUSCULAR | Status: AC | PRN
  Administered 2020-03-07: 100 mL via INTRAVENOUS

## 2020-03-07 MED ORDER — FENTANYL CITRATE (PF) 100 MCG/2ML IJ SOLN
50.0000 ug | Freq: Once | INTRAMUSCULAR | Status: AC
  Administered 2020-03-07: 50 ug via INTRAVENOUS
  Filled 2020-03-07: qty 2

## 2020-03-07 MED ORDER — METRONIDAZOLE 500 MG PO TABS
500.0000 mg | ORAL_TABLET | Freq: Three times a day (TID) | ORAL | 0 refills | Status: AC
Start: 2020-03-07 — End: 2020-03-17

## 2020-03-07 MED ORDER — METRONIDAZOLE 500 MG PO TABS
500.0000 mg | ORAL_TABLET | Freq: Once | ORAL | Status: AC
  Administered 2020-03-07: 500 mg via ORAL
  Filled 2020-03-07: qty 1

## 2020-03-07 MED ORDER — CIPROFLOXACIN HCL 500 MG PO TABS
500.0000 mg | ORAL_TABLET | Freq: Once | ORAL | Status: AC
  Administered 2020-03-07: 500 mg via ORAL
  Filled 2020-03-07: qty 1

## 2020-03-07 MED ORDER — SODIUM CHLORIDE 0.9 % IV BOLUS
1000.0000 mL | Freq: Once | INTRAVENOUS | Status: AC
  Administered 2020-03-07: 1000 mL via INTRAVENOUS

## 2020-03-07 MED ORDER — CIPROFLOXACIN HCL 250 MG PO TABS
500.0000 mg | ORAL_TABLET | Freq: Two times a day (BID) | ORAL | 0 refills | Status: AC
Start: 2020-03-07 — End: 2020-03-17

## 2020-03-07 NOTE — ED Provider Notes (Signed)
[facility] EMERGENCY DEPARTMENT Provider Note   CSN: [id number] Arrival date & time: 03/07/20  1828     History Chief Complaint  Patient presents with  . Abdominal Pain    Samuel Mccarthy is a 74 y.o. male.  The history is provided by the patient.  Abdominal Pain Pain location:  LLQ Pain quality: aching   Pain radiates to:  Does not radiate Pain severity:  Mild Onset quality:  Gradual Timing:  Constant Progression:  Unchanged Chronicity:  New Context: not previous surgeries   Context comment:  Diverticuliti hx Relieved by:  Nothing Worsened by:  Nothing Associated symptoms: diarrhea   Associated symptoms: no chest pain, no chills, no cough, no dysuria, no fever, no hematuria, no shortness of breath, no sore throat and no vomiting        Past Medical History:  Diagnosis Date  . Oral cancer ([place])   . Pancreatitis   . Stroke [facility])     There are no problems to display for this patient.   Past Surgical History:  Procedure Laterality Date  . REPLACEMENT TOTAL HIP W/  RESURFACING IMPLANTS         No family history on file.  Social History   Tobacco Use  . 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 Smokeless tobacco: Never Used  Substance Use Topics  . Alcohol use: Yes    Alcohol/week: 4.0 standard drinks    Types: 4 Cans of beer per week    Comment: every day  . Drug use: Not on file    Home Medications Prior to Admission medications   Medication Sig Start Date End Date Taking? Authorizing Provider  aspirin 325 MG EC tablet Take 325 mg by mouth daily.    [provider]  atorvastatin (LIPITOR) 20 MG tablet Take 20 mg by mouth daily.    [provider]  ciprofloxacin (CIPRO) 250 MG tablet Take 2 tablets (500 mg total) by mouth 2 (two) times daily for 10 days. 03/07/20 03/17/20  Kenniya Westrich, DO  clindamycin (CLEOCIN) 300 MG capsule Take 300 mg by mouth 3 (three) times daily.    [provider]  clopidogrel (PLAVIX) 75 MG  tablet Take 75 mg by mouth daily.    [provider]  Loratadine (CLARITIN PO) Take by mouth.    [provider]  metroNIDAZOLE (FLAGYL) 500 MG tablet Take 1 tablet (500 mg total) by mouth 3 (three) times daily for 10 days. 03/07/20 03/17/20  Shyan Scalisi, DO  morphine (MSIR) 15 MG tablet Take 0.5-1 tablets (7.5-15 mg total) by mouth every 6 (six) hours as needed for severe pain. 05/02/18   Quintella Reichert, MD  multivitamin/minerals (GOLDEN AGE) LIQD Take by mouth daily.    [provider]  ondansetron (ZOFRAN ODT) 4 MG disintegrating tablet Take 1 tablet (4 mg total) by mouth every 8 (eight) hours as needed for nausea. 03/11/15   [name], MD  Pantoprazole Sodium (PROTONIX PO) Take by mouth.    [provider]  sucralfate (CARAFATE) 1 G tablet Take 1 tablet (1 g total) by mouth 4 (four) times daily. 03/11/15   [name], MD    Allergies    Penicillins and Percocet [oxycodone-acetaminophen]  Review of Systems   Review of Systems  Constitutional: Negative for chills and fever.  HENT: Negative for ear pain and sore throat.   Eyes: Negative for pain and visual disturbance.  Respiratory: Negative for cough and shortness of breath.   Cardiovascular: Negative for  chest pain and palpitations.  Gastrointestinal: Positive for abdominal pain and diarrhea. Negative for vomiting.  Genitourinary: Negative for dysuria and hematuria.  Musculoskeletal: Negative for arthralgias and back pain.  Skin: Negative for color change and rash.  Neurological: Negative for seizures and syncope.  All other systems reviewed and are negative.   Physical Exam Updated Vital Signs  ED Triage Vitals  Enc Vitals Group     BP 03/07/20 1835 (!) 192/98     Pulse Rate 03/07/20 1835 76     Resp 03/07/20 1835 20     Temp 03/07/20 1835 98.6 F (37 C)     Temp Source 03/07/20 1835 Oral     SpO2 03/07/20 1835 99 %     Weight 03/07/20 1836 180 lb 8 oz (81.9 kg)     Height --      Head  Circumference --      Peak Flow --      Pain Score 03/07/20 1836 8     Pain Loc --      Pain Edu? --      Excl. in [place]? --     Physical Exam Vitals and nursing note reviewed.  Constitutional:      General: He is not in acute distress.    Appearance: He is well-developed. He is not ill-appearing.  HENT:     Head: Normocephalic and atraumatic.     Mouth/Throat:     Mouth: Mucous membranes are moist.  Eyes:     Extraocular Movements: Extraocular movements intact.     Conjunctiva/sclera: Conjunctivae normal.  Cardiovascular:     Rate and Rhythm: Normal rate and regular rhythm.     Heart sounds: Normal heart sounds. No murmur heard.   Pulmonary:     Effort: Pulmonary effort is normal. No respiratory distress.     Breath sounds: Normal breath sounds.  Abdominal:     General: Abdomen is flat. There is no distension.     Palpations: Abdomen is soft.     Tenderness: There is abdominal tenderness in the left lower quadrant. There is guarding. Negative signs include Murphy's sign and McBurney's sign.     Hernia: No hernia is present.  Musculoskeletal:     Cervical back: Neck supple.  Skin:    General: Skin is warm and dry.     Capillary Refill: Capillary refill takes less than 2 seconds.  Neurological:     General: No focal deficit present.     Mental Status: He is alert.     ED Results / Procedures / Treatments   Labs (all labs ordered are listed, but only abnormal results are displayed) Labs Reviewed  COMPREHENSIVE METABOLIC PANEL - Abnormal; Notable for the following components:      Result Value   Calcium 8.8 (*)    All other components within normal limits  CBC - Abnormal; Notable for the following components:   MCV 102.7 (*)    MCH 34.9 (*)    All other components within normal limits  URINALYSIS, ROUTINE W REFLEX MICROSCOPIC - Abnormal; Notable for the following components:   Hgb urine dipstick SMALL (*)    All other components within normal limits  URINALYSIS,  MICROSCOPIC (REFLEX) - Abnormal; Notable for the following components:   Bacteria, UA RARE (*)    All other components within normal limits  LIPASE, BLOOD    EKG None  Radiology CT ABDOMEN PELVIS W CONTRAST  Result Date: 03/07/2020 CLINICAL DATA:  Diverticulitis suspected EXAM: CT  ABDOMEN AND PELVIS WITH CONTRAST TECHNIQUE: Multidetector CT imaging of the abdomen and pelvis was performed using the standard protocol following bolus administration of intravenous contrast. CONTRAST:  150mL OMNIPAQUE IOHEXOL 300 MG/ML  SOLN COMPARISON:  CT 07/16/2018 FINDINGS: Lower chest: Scattered punctate calcified granulomata present in the lung bases. Additional calcified right hilar lymph nodes are present as well. Cardiac size at the upper limits of normal. Three-vessel coronary artery calcifications are present as well as calcifications on the mitral annulus and aortic leaflets. No pericardial effusion. Hepatobiliary: No worrisome focal liver lesions. Smooth liver surface contour. Normal hepatic attenuation. No focal liver abnormality is seen. Patient is post cholecystectomy. No frank biliary ductal dilatation. No calcified intraductal gallstones. Pancreas: Partial fatty replacement of the pancreas. No pancreatic ductal dilatation or surrounding inflammatory changes. Spleen: Normal in size. No concerning splenic lesions. Adrenals/Urinary Tract: Normal adrenal glands. Stable mild bilateral nonspecific perinephric stranding, can be seen with advanced age or diminished renal function. Kidneys enhance and excrete symmetrically. No concerning renal mass, urolithiasis or hydronephrosis. Urinary bladder is largely decompressed at the time of exam and therefore poorly evaluated by CT imaging. Furthermore, partially obscured by streak artifact from a right hip prosthesis. No gross bladder abnormality. Stomach/Bowel: Distal esophagus, stomach and duodenum are unremarkable. No small bowel thickening or dilatation. Appendix is  not visualized. No focal inflammation the vicinity of the cecum to suggest an occult appendicitis. Intramural fat seen within the terminal ileum, cecum and ascending colon is a nonspecific finding and can be seen as sequela of prior inflammation or with obese body habitus. There are numerous scattered colonic diverticula with more focal segmental thickening and inflammation of the proximal sigmoid colon centered upon a culprit diverticulum in the left lower quadrant (5/39, 2/65). No extraluminal gas. No free fluid. No organized collection or abscess. Minimal adjacent thickening of the peritoneum is likely reactive. More distal colon is unremarkable. Vascular/Lymphatic: Atherosclerotic calcifications within the abdominal aorta and branch vessels including more lamellated appearing calcification in the abdominal aorta. Plaque likely resulting in some multilevel ostial narrowing of the splanchnic and renal arteries, incompletely assessed on this non angiographic exam. No aneurysm or ectasia. No enlarged abdominopelvic lymph nodes. Reproductive: The prostate and seminal vesicles are unremarkable though partially obscured by streak artifact from right hip prosthesis. Other: No abdominopelvic free fluid or free gas. No bowel containing hernias. Musculoskeletal: Asymmetric atrophy of the right rectus sheath, can be seen in the setting of postsurgical denervation. Correlate with history of prior abdominal incision. Stable asymmetric sclerotic changes in the ileum are unchanged from prior and likely benign. Stable bone island at L4. The osseous structures appear diffusely demineralized which may limit detection of small or nondisplaced fractures. No acute or worrisome osseous lesions. Prior total right hip arthroplasty with associated streak artifact. No acute complication. Multilevel degenerative changes are present in the imaged portions of the spine. Additional degenerative changes in the pelvis and left hip. IMPRESSION:  1. Acute uncomplicated diverticulitis of the proximal sigmoid colon centered upon a culprit diverticulum in the left lower quadrant. No evidence of perforation or abscess formation. 2. Evidence of remote granulomatous disease in the lung bases. 3. Prior right hip arthroplasty without acute complication. 4. Aortic Atherosclerosis (ICD10-I70.0). 5. Coronary artery calcifications are present. Please note that the presence of coronary artery calcium documents the presence of coronary artery disease, the severity of this disease and any potential stenosis cannot be assessed on this non-gated CT examination. Assessment for potential risk factor modification, dietary therapy or pharmacologic  therapy may be warranted. Electronically Signed   By: Lovena Le M.D.   On: 03/07/2020 22:06    Procedures Procedures (including critical care time)  Medications Ordered in ED Medications  ciprofloxacin (CIPRO) tablet 500 mg (has no administration in time range)  metroNIDAZOLE (FLAGYL) tablet 500 mg (has no administration in time range)  iohexol (OMNIPAQUE) 300 MG/ML solution 100 mL (100 mLs Intravenous Contrast Given 03/07/20 2143)  fentaNYL (SUBLIMAZE) injection 50 mcg (50 mcg Intravenous Given 03/07/20 2131)  sodium chloride 0.9 % bolus 1,000 mL (1,000 mLs Intravenous New Bag/Given 03/07/20 2220)    ED Course  I have reviewed the triage vital signs and the nursing notes.  Pertinent labs & imaging results that were available during my care of the patient were reviewed by me and considered in my medical decision making (see chart for details).    MDM Rules/Calculators/A&P                          Herbie Baltimore B. Shewell is a 74 year old male with history of pancreatitis, diverticulitis who presents to the ED with left lower quadrant abdominal pain.  Normal vitals.  No fever.  Pain for the last several hours.  Tender in the left lower quadrant.  No urinary symptoms.  Has been having diarrhea.  No nausea, no vomiting.   Lower concern for small bowel obstruction.  Suspect diverticulitis.  UTI.  Will get lab work and CT scan abdomen and pelvis.  No significant anemia, electrolyte abnormality, kidney injury.  Lipase normal.  Doubt pancreatitis.  Urinalysis negative for infection.  Awaiting CT scan abdomen pelvis.  Given IV fluids, IV fentanyl.  Patient with acute uncomplicated diverticulitis.  Patient without evidence of perforation, no abscess.  Will prescribe ciprofloxacin and Flagyl.  Will follow up with primary care doctor.  Understands return precautions.  This chart was dictated using voice recognition software.  Despite best efforts to proofread,  errors can occur which can change the documentation meaning.    Final Clinical Impression(s) / ED Diagnoses Final diagnoses:  Acute diverticulitis    Rx / DC Orders ED Discharge Orders         Ordered    ciprofloxacin (CIPRO) 250 MG tablet  2 times daily     Discontinue  Reprint     03/07/20 2226    metroNIDAZOLE (FLAGYL) 500 MG tablet  3 times daily     Discontinue  Reprint     03/07/20 2226           Lennice Sites, DO 03/07/20 2227

## 2020-03-07 NOTE — ED Triage Notes (Signed)
Abdominal LLQ. Noon, HX of abd infections and pancreatitis.

## 2020-09-11 ENCOUNTER — Emergency Department (HOSPITAL_BASED_OUTPATIENT_CLINIC_OR_DEPARTMENT_OTHER)
Admission: EM | Admit: 2020-09-11 | Discharge: 2020-09-12 | Disposition: A | Discharge status: Home / Self Care | Payer: Medicare HMO | Attending: Emergency Medicine | Admitting: Emergency Medicine

## 2020-09-11 ENCOUNTER — Other Ambulatory Visit: Payer: Self-pay

## 2020-09-11 ENCOUNTER — Encounter (HOSPITAL_BASED_OUTPATIENT_CLINIC_OR_DEPARTMENT_OTHER): Payer: Self-pay | Admitting: *Deleted

## 2020-09-11 ENCOUNTER — Emergency Department (HOSPITAL_BASED_OUTPATIENT_CLINIC_OR_DEPARTMENT_OTHER): Payer: Medicare HMO

## 2020-09-11 DIAGNOSIS — Z7982 Long term (current) use of aspirin: Secondary | ICD-10-CM | POA: Diagnosis not present

## 2020-09-11 DIAGNOSIS — R42 Dizziness and giddiness: Secondary | ICD-10-CM | POA: Insufficient documentation

## 2020-09-11 DIAGNOSIS — Z7902 Long term (current) use of antithrombotics/antiplatelets: Secondary | ICD-10-CM | POA: Diagnosis not present

## 2020-09-11 DIAGNOSIS — Z85818 Personal history of malignant neoplasm of other sites of lip, oral cavity, and pharynx: Secondary | ICD-10-CM | POA: Insufficient documentation

## 2020-09-11 DIAGNOSIS — Z87891 Personal history of nicotine dependence: Secondary | ICD-10-CM | POA: Diagnosis not present

## 2020-09-11 DIAGNOSIS — Z96649 Presence of unspecified artificial hip joint: Secondary | ICD-10-CM | POA: Diagnosis not present

## 2020-09-11 DIAGNOSIS — R112 Nausea with vomiting, unspecified: Secondary | ICD-10-CM | POA: Insufficient documentation

## 2020-09-11 LAB — CBC WITH DIFFERENTIAL/PLATELET
Abs Immature Granulocytes: 0.03 10*3/uL (ref 0.00–0.07)
Basophils Absolute: 0 10*3/uL (ref 0.0–0.1)
Basophils Relative: 0 %
Eosinophils Absolute: 0.1 10*3/uL (ref 0.0–0.5)
Eosinophils Relative: 1 %
HCT: 41.9 % (ref 39.0–52.0)
Hemoglobin: 14.5 g/dL (ref 13.0–17.0)
Immature Granulocytes: 0 %
Lymphocytes Relative: 18 %
Lymphs Abs: 1.6 10*3/uL (ref 0.7–4.0)
MCH: 35.8 pg — ABNORMAL HIGH (ref 26.0–34.0)
MCHC: 34.6 g/dL (ref 30.0–36.0)
MCV: 103.5 fL — ABNORMAL HIGH (ref 80.0–100.0)
Monocytes Absolute: 0.6 10*3/uL (ref 0.1–1.0)
Monocytes Relative: 7 %
Neutro Abs: 6.6 10*3/uL (ref 1.7–7.7)
Neutrophils Relative %: 74 %
Platelets: 175 10*3/uL (ref 150–400)
RBC: 4.05 MIL/uL — ABNORMAL LOW (ref 4.22–5.81)
RDW: 11.7 % (ref 11.5–15.5)
WBC: 9 10*3/uL (ref 4.0–10.5)
nRBC: 0 % (ref 0.0–0.2)

## 2020-09-11 LAB — BASIC METABOLIC PANEL
Anion gap: 10 (ref 5–15)
BUN: 17 mg/dL (ref 8–23)
CO2: 24 mmol/L (ref 22–32)
Calcium: 8.9 mg/dL (ref 8.9–10.3)
Chloride: 102 mmol/L (ref 98–111)
Creatinine, Ser: 1.13 mg/dL (ref 0.61–1.24)
GFR, Estimated: >60 mL/min (ref >60)
Glucose, Bld: 125 mg/dL — ABNORMAL HIGH (ref 70–99)
Potassium: 4.2 mmol/L (ref 3.5–5.1)
Sodium: 136 mmol/L (ref 135–145)

## 2020-09-11 MED ORDER — MECLIZINE HCL 25 MG PO TABS
25.0000 mg | ORAL_TABLET | Freq: Once | ORAL | Status: AC
  Administered 2020-09-11: 25 mg via ORAL
  Filled 2020-09-11: qty 1

## 2020-09-11 MED ORDER — ONDANSETRON HCL 4 MG/2ML IJ SOLN
4.0000 mg | Freq: Once | INTRAMUSCULAR | Status: AC
  Administered 2020-09-11: 4 mg via INTRAVENOUS
  Filled 2020-09-11: qty 2

## 2020-09-11 MED ORDER — LABETALOL HCL 5 MG/ML IV SOLN
10.0000 mg | Freq: Once | INTRAVENOUS | Status: AC
  Administered 2020-09-11: 10 mg via INTRAVENOUS
  Filled 2020-09-11: qty 4

## 2020-09-11 NOTE — ED Notes (Signed)
MRI has been notified of pt arrival from [facility]

## 2020-09-11 NOTE — ED Notes (Signed)
Pt arrives via POV with spouse, Pt is alert and oriented. Pt with equal grips, clear speech, denies sensory changes. Reports unsteady gait started around 1700 today, and just before 1900 started having dizziness. Says they gave him medication PTA and he feels improvement in his dizziness, but still having difficulty ambulating. Previous hx of stroke. Denies pain. IV intact.

## 2020-09-11 NOTE — ED Notes (Signed)
Pt sent with wife POV to [facility] to have an MRI completed. Pt taken by wheelchair to POV and sent with IV intact.

## 2020-09-11 NOTE — ED Provider Notes (Signed)
[place] EMERGENCY DEPARTMENT Provider Note   CSN: [id number] Arrival date & time: 09/11/20  1900     History Chief Complaint  Patient presents with  . Dizziness    Samuel Mccarthy is a 75 y.o. male.  The ER chief complaint of dizziness and unsteady gait.  Symptoms began earlier today.  He states that when he lies still symptoms appear to improve but when he tries to walk he feels very unsteady like he is going to fall.  Associated nausea and vomiting.  Denies headache or chest pain.  Denies fall or injury otherwise.  No fever no cough no chest pain or abdominal pain.        Past Medical History:  Diagnosis Date  . Oral cancer ([place])   . Pancreatitis   . Stroke [facility])     There are no problems to display for this patient.   Past Surgical History:  Procedure Laterality Date  . REPLACEMENT TOTAL HIP W/  RESURFACING IMPLANTS         No family history on file.  Social History   Tobacco Use  . 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 Smokeless tobacco: Never Used  Substance Use Topics  . Alcohol use: Yes    Alcohol/week: 4.0 standard drinks    Types: 4 Cans of beer per week    Comment: every day    Home Medications Prior to Admission medications   Medication Sig Start Date End Date Taking? Authorizing Provider  aspirin 325 MG EC tablet Take 325 mg by mouth daily.   Yes [provider]  atorvastatin (LIPITOR) 20 MG tablet Take 20 mg by mouth daily.   Yes [provider]  Loratadine (CLARITIN PO) Take by mouth.   Yes [provider]  multivitamin/minerals (GOLDEN AGE) LIQD Take by mouth daily.   Yes [provider]  Pantoprazole Sodium (PROTONIX PO) Take by mouth.   Yes [provider]  clindamycin (CLEOCIN) 300 MG capsule Take 300 mg by mouth 3 (three) times daily.    [provider]  clopidogrel (PLAVIX) 75 MG tablet Take 75 mg by mouth daily.    [provider]  morphine (MSIR) 15 MG tablet  Take 0.5-1 tablets (7.5-15 mg total) by mouth every 6 (six) hours as needed for severe pain. 05/02/18   Quintella Reichert, MD  ondansetron (ZOFRAN ODT) 4 MG disintegrating tablet Take 1 tablet (4 mg total) by mouth every 8 (eight) hours as needed for nausea. 03/11/15   [name], MD  sucralfate (CARAFATE) 1 G tablet Take 1 tablet (1 g total) by mouth 4 (four) times daily. 03/11/15   [name], MD    Allergies    Penicillins and Percocet [oxycodone-acetaminophen]  Review of Systems   Review of Systems  Constitutional: Negative for fever.  HENT: Negative for ear pain and sore throat.   Eyes: Negative for pain.  Respiratory: Negative for cough.   Cardiovascular: Negative for chest pain.  Gastrointestinal: Negative for abdominal pain.  Genitourinary: Negative for flank pain.  Musculoskeletal: Negative for back pain.  Skin: Negative for color change and rash.  Neurological: Negative for syncope.  All other systems reviewed and are negative.   Physical Exam Updated Vital Signs BP (!) 176/74   Pulse 64   Temp 97.6 F (36.4 C) (Oral)   Resp 17   Ht 5\' 5"  (1.651 m)   Wt 86.2 kg   SpO2 95%   BMI 31.62 kg/m   Physical Exam Constitutional:  General: He is not in acute distress.    Appearance: He is well-developed.  HENT:     Head: Normocephalic.     Nose: Nose normal.  Eyes:     Extraocular Movements: Extraocular movements intact.  Cardiovascular:     Rate and Rhythm: Normal rate.  Pulmonary:     Effort: Pulmonary effort is normal.  Skin:    Coloration: Skin is not jaundiced.  Neurological:     Mental Status: He is alert. Mental status is at baseline.     Comments: Intact cranial nerves II to XII.  Strength 5/5 all extremities.  Finger-nose intact heel-to-shin intact as well.       ED Results / Procedures / Treatments   Labs (all labs ordered are listed, but only abnormal results are displayed) Labs Reviewed  CBC WITH DIFFERENTIAL/PLATELET - Abnormal; Notable for  the following components:      Result Value   RBC 4.05 (*)    MCV 103.5 (*)    MCH 35.8 (*)    All other components within normal limits  BASIC METABOLIC PANEL - Abnormal; Notable for the following components:   Glucose, Bld 125 (*)    All other components within normal limits    EKG None  Radiology CT Head Wo Contrast  Result Date: 09/11/2020 CLINICAL DATA:  Dizziness and vomiting. EXAM: CT HEAD WITHOUT CONTRAST TECHNIQUE: Contiguous axial images were obtained from the base of the skull through the vertex without intravenous contrast. COMPARISON:  None. FINDINGS: Brain: Somewhat age advanced cerebral atrophy, ventriculomegaly and periventricular white matter disease. Remote appearing lacunar type infarct noted in the left thalamus. No extra-axial fluid collections are identified. No CT findings for acute hemispheric infarction or intracranial hemorrhage. No mass lesions. The brainstem and cerebellum are normal. Vascular: Advanced vascular calcifications but no aneurysm or hyperdense vessels. Skull: No skull fracture or bone lesions. Sinuses/Orbits: The paranasal sinuses and mastoid air cells are clear. The globes are intact. Other: No scalp lesions or scalp hematoma. IMPRESSION: 1. Somewhat age advanced cerebral atrophy, ventriculomegaly and periventricular white matter disease. 2. Remote appearing lacunar type infarct noted in the left thalamus. 3. No acute intracranial findings or mass lesions. Electronically Signed   By: Marijo Sanes M.D.   On: 09/11/2020 20:55    Procedures Procedures   Medications Ordered in ED Medications  meclizine (ANTIVERT) tablet 25 mg (25 mg Oral Given 09/11/20 2055)  labetalol (NORMODYNE) injection 10 mg (10 mg Intravenous Given 09/11/20 2124)    ED Course  I have reviewed the triage vital signs and the nursing notes.  Pertinent labs & imaging results that were available during my care of the patient were reviewed by me and considered in my medical decision  making (see chart for details).    MDM Rules/Calculators/A&P                          Labs unremarkable.  CT imaging pursued showing likely old appearing infarcts.  Patient given meclizine, on repeat evaluation continues to have a unsteady gait.  On hints exam patient has bidirectional nystagmus, normal head impulse test, and normal test of skew noted.  Case discussed with Dr.Zavitz from [facility], will be transferred there for MRI imaging.     Final Clinical Impression(s) / ED Diagnoses Final diagnoses:  Dizziness    Rx / DC Orders ED Discharge Orders    None       Luna Fuse, MD 09/11/20  2158  

## 2020-09-11 NOTE — ED Triage Notes (Signed)
Dizziness and vomiting x 2 hours ago.

## 2020-09-11 NOTE — ED Notes (Signed)
Pt to MRI

## 2020-09-11 NOTE — ED Notes (Signed)
Called [facility]. Spoke to [employer], [place]. Updated about pt coming POV and still having IV intact.

## 2020-09-11 NOTE — ED Notes (Signed)
Spoke to provider about sending pt POV due to possible delay with transport. Wife stated that she felt comfortable taking pt. Pt agreed about going POV. IV will be intact for transport via POV.

## 2020-09-12 ENCOUNTER — Emergency Department (HOSPITAL_COMMUNITY): Payer: Medicare HMO

## 2020-09-12 MED ORDER — ONDANSETRON 4 MG PO TBDP
4.0000 mg | ORAL_TABLET | Freq: Three times a day (TID) | ORAL | 0 refills | Status: AC | PRN
Start: 2020-09-12 — End: ?

## 2020-09-12 MED ORDER — MECLIZINE HCL 25 MG PO TABS: 25.0000 mg | ORAL_TABLET | Freq: Three times a day (TID) | ORAL | 0 refills | Status: AC | PRN

## 2020-09-12 NOTE — ED Provider Notes (Incomplete)
[facility] EMERG[facility]Y DE[facility] Provider Note   CSN: [id number] Arrival date & time: 09/11/20  1900     History Chief Complaint  Patient presents with  . Dizziness    Samuel Mccarthy is a 75 y.o. male.  HPI Patient is a 75 year old gentleman with past medical history significant for strokes and bilateral carotid artery stenosis with stenting followed by vascular surgery.  Patient was sent to the emergency department from [facility].  He was sent for MRI due to concern for posterior cerebellar stroke or other central process causing patient's vertiginous symptoms.  He states that his symptoms began 2 hours before he came to [facility] which was approximately 7 PM at time of symptom onset.  He states that he has intermittent episodes of feeling like the room is spinning and feels a bit like he cannot walk quite like he normally does because he feels off balance.  He states that he normally walks with a limp because of some hip problems.  He does not use a cane or any other device to ambulate at baseline.  He denies any visual changes.  Did have some nausea without vomiting  He states that he has not moved significantly since he arrived at [facility] he states that his symptoms are now resolved after he was given meclizine.  No other associated symptoms.  No aggravating factors.     Past Medical History:  Diagnosis Date  . Oral cancer ([place])   . Pancreatitis   . Stroke [facility]     There are no problems to display for this patient.   Past Surgical History:  Procedure Laterality Date  . REPLACEMENT TOTAL HIP W/  RESURFACING IMPLANTS         No family history on file.  Social History   Tobacco Use  . 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 Smokeless tobacco: Never Used  Substance Use Topics  . Alcohol use: Yes    Alcohol/week: 4.0 standard drinks    Types: 4 Cans of beer per week    Comment: every day    Home  Medications Prior to Admission medications   Medication Sig Start Date End Date Taking? Authorizing Provider  aspirin 325 MG EC tablet Take 325 mg by mouth daily.   Yes [provider]  atorvastatin (LIPITOR) 20 MG tablet Take 20 mg by mouth daily.   Yes [provider]  Loratadine (CLARITIN PO) Take by mouth.   Yes [provider]  multivitamin/minerals (GOLDEN AGE) LIQD Take by mouth daily.   Yes [provider]  Pantoprazole Sodium (PROTONIX PO) Take by mouth.   Yes [provider]  clindamycin (CLEOCIN) 300 MG capsule Take 300 mg by mouth 3 (three) times daily.    [provider]  clopidogrel (PLAVIX) 75 MG tablet Take 75 mg by mouth daily.    [provider]  morphine (MSIR) 15 MG tablet Take 0.5-1 tablets (7.5-15 mg total) by mouth every 6 (six) hours as needed for severe pain. 05/02/18   Quintella Reichert, MD  ondansetron (ZOFRAN ODT) 4 MG disintegrating tablet Take 1 tablet (4 mg total) by mouth every 8 (eight) hours as needed for nausea. 03/11/15   [name], MD  sucralfate (CARAFATE) 1 G tablet Take 1 tablet (1 g total) by mouth 4 (four) times daily. 03/11/15   [name], MD    Allergies    Penicillins and Percocet [oxycodone-acetaminophen]  Review of Systems  Review of Systems  Physical Exam Updated Vital Signs BP (!) 160/77 (BP Location: Left Arm)   Pulse 71   Temp 98.1 F (36.7 C) (Oral)   Resp 15   Ht 5\' 5"  (1.651 m)   Wt 86.2 kg   SpO2 94%   BMI 31.62 kg/m   Physical Exam  ED Results / Procedures / Treatments   Labs (all labs ordered are listed, but only abnormal results are displayed) Labs Reviewed  CBC WITH DIFFERENTIAL/PLATELET - Abnormal; Notable for the following components:      Result Value   RBC 4.05 (*)    MCV 103.5 (*)    MCH 35.8 (*)    All other components within normal limits  BASIC METABOLIC PANEL - Abnormal; Notable for the following components:   Glucose, Bld 125 (*)    All  other components within normal limits    EKG EKG Interpretation  Date/Time:  Monday September 11 2020 19:03:55 EST Ventricular Rate:  66 PR Interval:  192 QRS Duration: 82 QT Interval:  374 QTC Calculation: 392 R Axis:   -5 Text Interpretation: Normal sinus rhythm Minimal voltage criteria for LVH, may be normal variant ( R in aVL ) Borderline ECG Confirmed by Thamas Jaegers ([id number]) on 09/11/2020 10:02:37 PM   Radiology CT Head Wo Contrast  Result Date: 09/11/2020 CLINICAL DATA:  Dizziness and vomiting. EXAM: CT HEAD WITHOUT CONTRAST TECHNIQUE: Contiguous axial images were obtained from the base of the skull through the vertex without intravenous contrast. COMPARISON:  None. FINDINGS: Brain: Somewhat age advanced cerebral atrophy, ventriculomegaly and periventricular white matter disease. Remote appearing lacunar type infarct noted in the left thalamus. No extra-axial fluid collections are identified. No CT findings for acute hemispheric infarction or intracranial hemorrhage. No mass lesions. The brainstem and cerebellum are normal. Vascular: Advanced vascular calcifications but no aneurysm or hyperdense vessels. Skull: No skull fracture or bone lesions. Sinuses/Orbits: The paranasal sinuses and mastoid air cells are clear. The globes are intact. Other: No scalp lesions or scalp hematoma. IMPRESSION: 1. Somewhat age advanced cerebral atrophy, ventriculomegaly and periventricular white matter disease. 2. Remote appearing lacunar type infarct noted in the left thalamus. 3. No acute intracranial findings or mass lesions. Electronically Signed   By: Marijo Sanes M.D.   On: 09/11/2020 20:55   MR BRAIN WO CONTRAST  Result Date: 09/12/2020 CLINICAL DATA:  Unsteady gait with dizziness EXAM: MRI HEAD WITHOUT CONTRAST TECHNIQUE: Multiplanar, multiecho pulse sequences of the brain and surrounding structures were obtained without intravenous contrast. COMPARISON:  None. FINDINGS: Brain: No acute infarct, mass  effect or extra-axial collection. Chronic microhemorrhages in the left cerebellum and left occipital lobe. There is multifocal hyperintense T2-weighted signal within the white matter. Generalized volume loss without a clear lobar predilection. Old bilateral cerebellar, left thalamic and right caudate small vessel infarct. Vascular: Major flow voids are preserved. Skull and upper cervical spine: Normal calvarium and skull base. Visualized upper cervical spine and soft tissues are normal. Sinuses/Orbits:No paranasal sinus fluid levels or advanced mucosal thickening. No mastoid or middle ear effusion. Normal orbits. IMPRESSION: 1. No acute intracranial abnormality. 2. Generalized volume loss and findings of chronic ischemic microangiopathy. 3. 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 Electronically Signed   By: Ulyses Jarred M.D.   On: 09/12/2020 02:24    Procedures Procedures {Remember to document critical care time when appropriate:1}  Medications Ordered in ED Medications  meclizine (ANTIVERT) tablet 25 mg (25 mg Oral Given 09/11/20 2055)  labetalol (NORMODYNE) injection 10 mg (10 mg Intravenous Given 09/11/20 2124)  ondansetron (ZOFRAN) injection 4 mg (4 mg Intravenous Given 09/11/20 2223)    ED Course  I have reviewed the triage vital signs and the nursing notes.  Pertinent labs & imaging results that were available during my care of the patient were reviewed by me and considered in my medical decision making (see chart for details).  Clinical Course as of 09/12/20 0324  Tue Sep 12, 2020  2778 On 12/13/19 pt was seen by vascular surgery for his bilateral carotid artery stenosis with stents.  These were widely patent.  "Asymptomatic carotid artery disease, s/p bilateral ICA stents: -Duplex results are stable with widely patent stents bilaterally. " [WF]    Clinical Course User Index [WF] Tedd Sias, PA   MDM Rules/Calculators/A&P                           *** Final Clinical Impression(s) / ED Diagnoses Final diagnoses:  Dizziness    Rx / DC Orders ED Discharge Orders    None

## 2020-09-12 NOTE — ED Notes (Signed)
Called MRI to check on status (pt still in lobby), states approx 30 minutes at this time to come get pt. Made pt aware of the same.

## 2020-09-12 NOTE — Discharge Instructions (Signed)
Your work-up in ER today was reassuring.  Your MRI showed no new strokes today.  As we discussed this is most likely a inner ear issue.  As you already have an ear nose and throat doctor you may follow-up with them however I recommend you follow-up with your primary care doctor.  Please drink plenty of water.  Please take your scheduled medications as prescribed.  I have prescribed you meclizine which is the medication you were given in the ER prior to being transferred to [facility].  You may use this if you have additional symptoms.  It is important note that meclizine can increase her risk of falls.  I recommend if you are feeling dizzy and therefore using meclizine please make sure that you are in safe space and sitting down.  You may always return to the ER for new or persistent symptoms.

## 2020-09-12 NOTE — ED Notes (Signed)
Updated pt spouse on pt status per pt request

## 2020-09-12 NOTE — ED Notes (Signed)
ED Provider at bedside. 

## 2021-12-05 IMAGING — MR MR HEAD W/O CM
12 of 13 series · 44 of 48 positions shown · non-contrast
Comparison: None.

CLINICAL DATA: Unsteady gait with dizziness

EXAM:
MRI HEAD WITHOUT CONTRAST
TECHNIQUE: Multiplanar, multiecho pulse sequences of the brain and surrounding
structures were obtained without intravenous contrast.

[Series 5: DWI · axial · 3.0mm · 0.88mm/px · z∈[-74,+70]mm · 8 of 100 slices shown (1 of 4)]
[im 1/100]
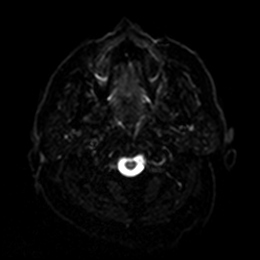
[im 15/100]
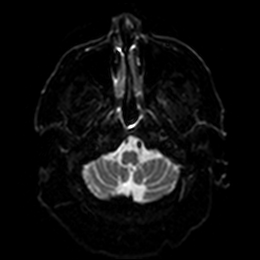
[im 29/100]
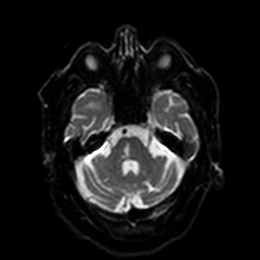
[im 43/100]
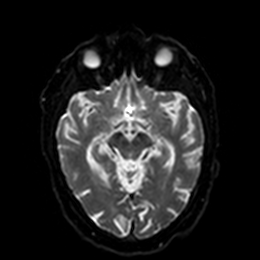
[im 57/100]
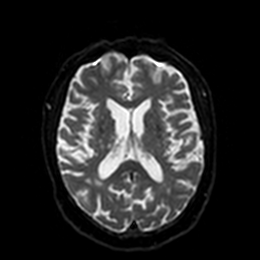
[im 71/100]
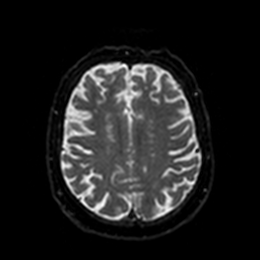
[im 85/100]
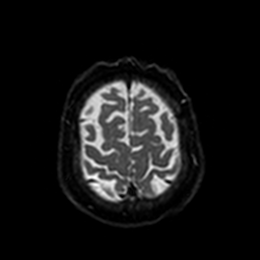
[im 100/100]
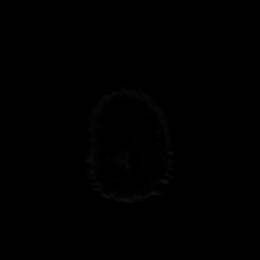

[Series 6: DWI · axial · 3.0mm · 0.88mm/px · z∈[-74,+70]mm · 4 of 50 slices shown (2 of 4)]
[im 1/50]
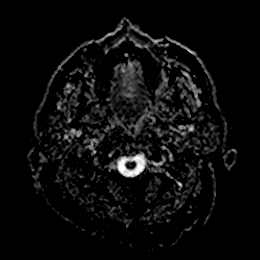
[im 17/50]
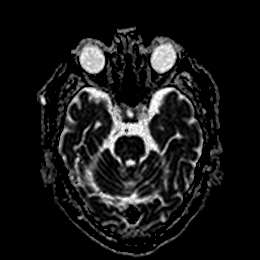
[im 33/50]
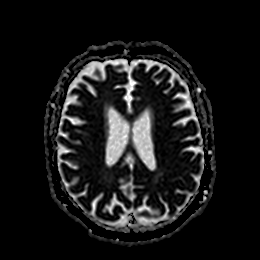
[im 50/50]
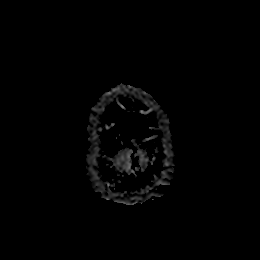

[Series 7: DWI · coronal · 4.0mm · 0.88mm/px · 5 of 68 slices shown (3 of 4)]
[im 1/68]
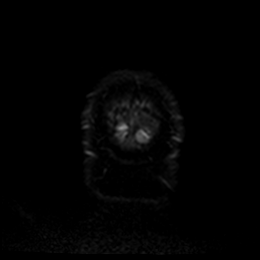
[im 17/68]
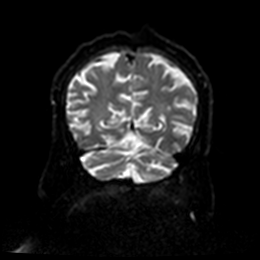
[im 34/68]
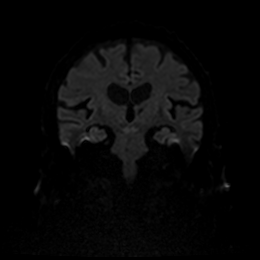
[im 51/68]
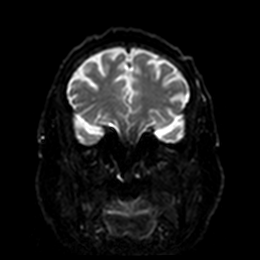
[im 68/68]
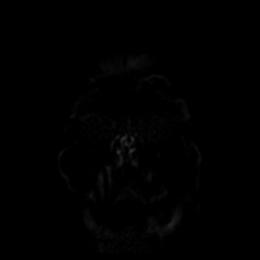

[Series 8: DWI · coronal · 4.0mm · 0.88mm/px · 3 of 34 slices shown (4 of 4)]
[im 1/34]
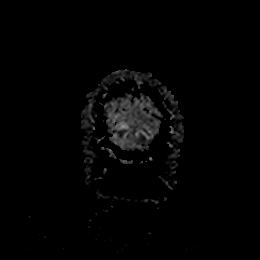
[im 17/34]
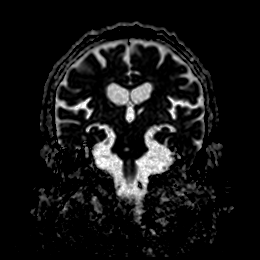
[im 34/34]
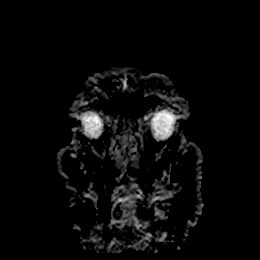

[Series 9: T1 · sagittal · 5.0mm · 0.75mm/px · 2 of 23 slices shown]
[im 1/23]
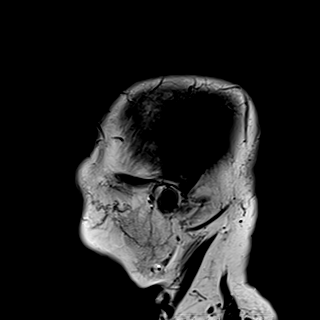
[im 23/23]
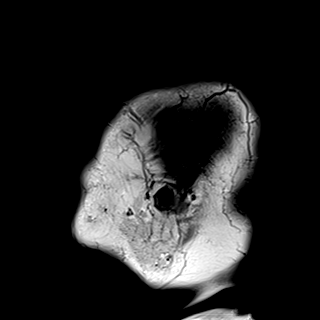

[Series 10: T2 · axial · 5.0mm · 0.72mm/px · z∈[-76,+65]mm · 2 of 25 slices shown (1 of 2)]
[im 1/25]
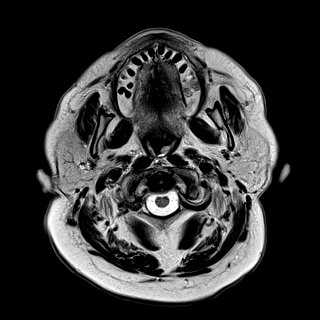
[im 25/25]
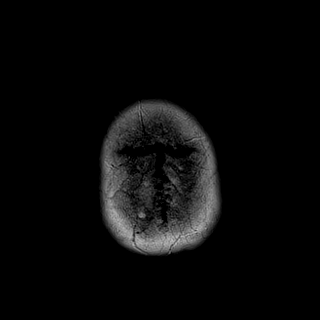

[Series 11: FLAIR · axial · 5.0mm · 0.45mm/px · z∈[-75,+67]mm · 2 of 25 slices shown]
[im 1/25]
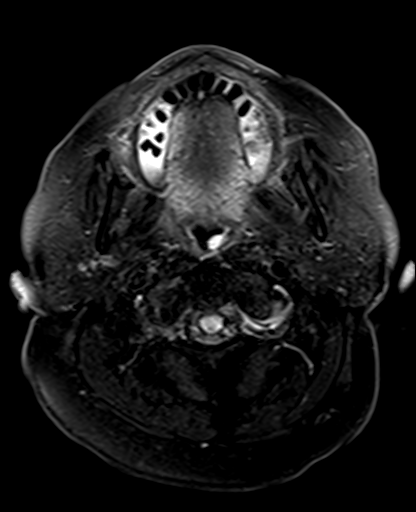
[im 25/25]
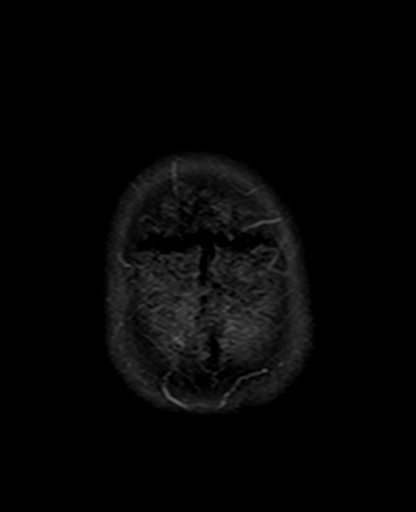

[Series 12: mag_images · axial · 3.0mm · 0.90mm/px · z∈[-79,+71]mm · 4 of 52 slices shown]
[im 1/52]
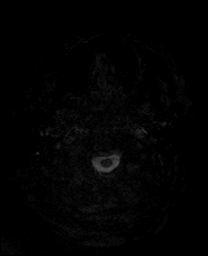
[im 18/52]
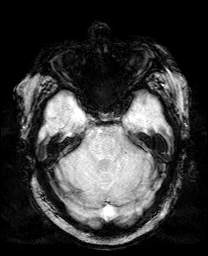
[im 35/52]
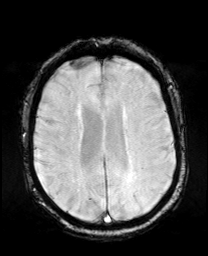
[im 52/52]
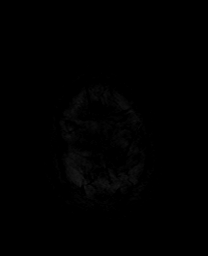

[Series 13: pha_images · axial · 3.0mm · 0.90mm/px · z∈[-79,+71]mm · 4 of 52 slices shown]
[im 1/52]
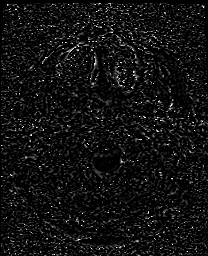
[im 18/52]
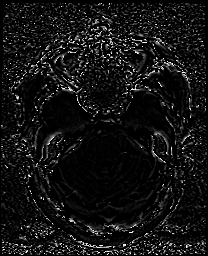
[im 35/52]
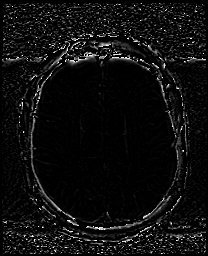
[im 52/52]
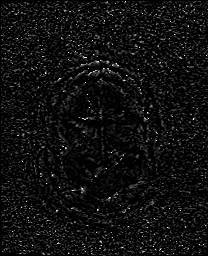

[Series 14: swi_images · axial · 3.0mm · 0.90mm/px · z∈[-79,+71]mm · 4 of 52 slices shown]
[im 1/52]
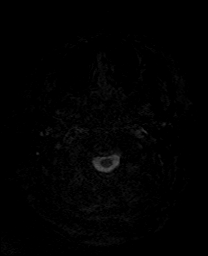
[im 18/52]
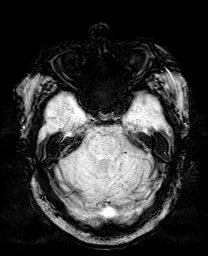
[im 35/52]
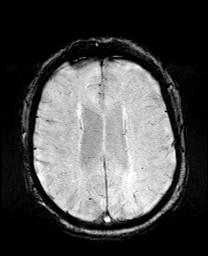
[im 52/52]
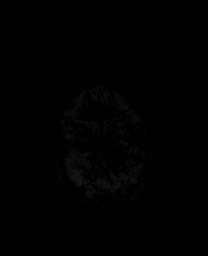

[Series 15: mip_images(sw) · axial · 24.0mm · 0.90mm/px · z∈[-69,+61]mm · 4 of 45 slices shown]
[im 1/45]
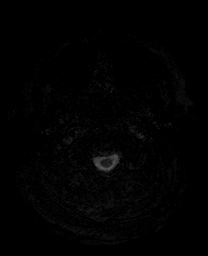
[im 15/45]
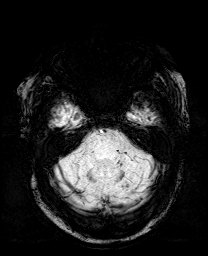
[im 30/45]
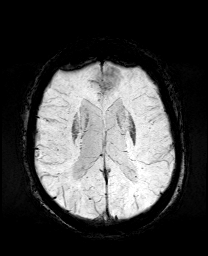
[im 45/45]
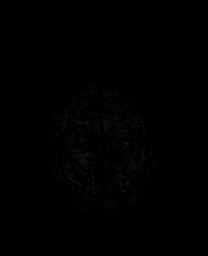

[Series 17: T2 · coronal · 5.0mm · 0.34mm/px · 2 of 29 slices shown (2 of 2)]
[im 1/29]
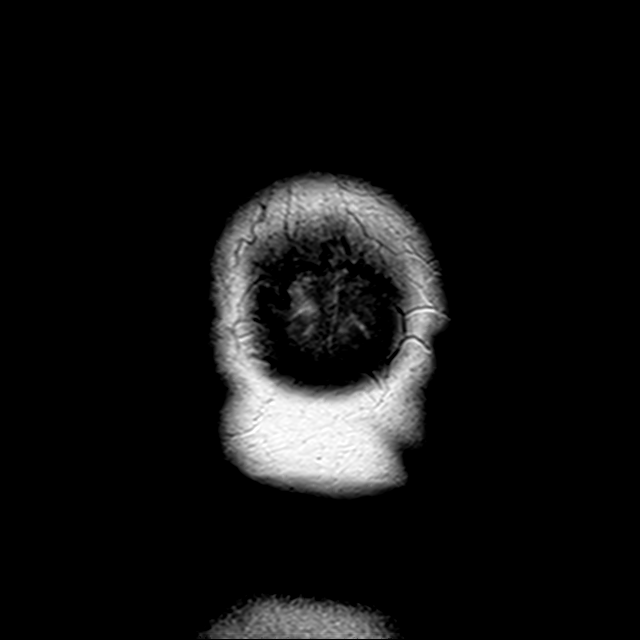
[im 29/29]
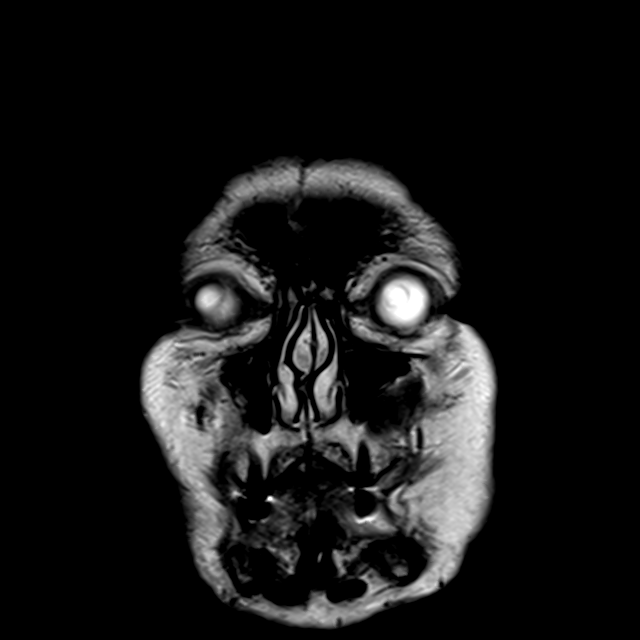

[44 of 48 positions shown; findings below may reference images not displayed]

FINDINGS: Brain: No acute infarct, mass effect or extra-axial collection.
Chronic microhemorrhages in the left cerebellum and left occipital
lobe. There is multifocal hyperintense T2-weighted signal within the
white matter. Generalized volume loss without a clear lobar
predilection. Old bilateral cerebellar, left thalamic and right
caudate small vessel infarct.

Vascular: Major flow voids are preserved.

Skull and upper cervical spine: Normal calvarium and skull base.
Visualized upper cervical spine and soft tissues are normal.

Sinuses/Orbits:No paranasal sinus fluid levels or advanced mucosal
thickening. No mastoid or middle ear effusion. Normal orbits.
IMPRESSION: 1. No acute intracranial abnormality.
2. Generalized volume loss and findings of chronic ischemic
microangiopathy.
3. Old bilateral cerebellar, left thalamic and right caudate small
vessel infarcts.
